# Patient Record
Sex: Female | Born: 1965
Health system: Southern US, Community
[De-identification: ages and names within clinical notes are randomized; demographics above are authoritative.]

## PROBLEM LIST (undated history)

## (undated) DIAGNOSIS — G43909 Migraine, unspecified, not intractable, without status migrainosus: Secondary | ICD-10-CM

## (undated) DIAGNOSIS — M51369 Other intervertebral disc degeneration, lumbar region without mention of lumbar back pain or lower extremity pain: Secondary | ICD-10-CM

## (undated) DIAGNOSIS — E119 Type 2 diabetes mellitus without complications: Secondary | ICD-10-CM

## (undated) DIAGNOSIS — D649 Anemia, unspecified: Secondary | ICD-10-CM

## (undated) DIAGNOSIS — H6690 Otitis media, unspecified, unspecified ear: Secondary | ICD-10-CM

## (undated) DIAGNOSIS — Z87898 Personal history of other specified conditions: Secondary | ICD-10-CM

## (undated) DIAGNOSIS — Z8679 Personal history of other diseases of the circulatory system: Secondary | ICD-10-CM

## (undated) DIAGNOSIS — E785 Hyperlipidemia, unspecified: Secondary | ICD-10-CM

## (undated) DIAGNOSIS — M5136 Other intervertebral disc degeneration, lumbar region: Secondary | ICD-10-CM

## (undated) DIAGNOSIS — R197 Diarrhea, unspecified: Secondary | ICD-10-CM

## (undated) DIAGNOSIS — L0390 Cellulitis, unspecified: Secondary | ICD-10-CM

## (undated) DIAGNOSIS — M545 Low back pain, unspecified: Secondary | ICD-10-CM

## (undated) DIAGNOSIS — M797 Fibromyalgia: Secondary | ICD-10-CM

## (undated) DIAGNOSIS — I8390 Asymptomatic varicose veins of unspecified lower extremity: Secondary | ICD-10-CM

## (undated) DIAGNOSIS — K219 Gastro-esophageal reflux disease without esophagitis: Secondary | ICD-10-CM

## (undated) DIAGNOSIS — Z8709 Personal history of other diseases of the respiratory system: Secondary | ICD-10-CM

## (undated) DIAGNOSIS — M179 Osteoarthritis of knee, unspecified: Secondary | ICD-10-CM

## (undated) DIAGNOSIS — M1710 Unilateral primary osteoarthritis, unspecified knee: Secondary | ICD-10-CM

## (undated) DIAGNOSIS — F32A Depression, unspecified: Secondary | ICD-10-CM

## (undated) DIAGNOSIS — F329 Major depressive disorder, single episode, unspecified: Secondary | ICD-10-CM

## (undated) DIAGNOSIS — S62609A Fracture of unspecified phalanx of unspecified finger, initial encounter for closed fracture: Secondary | ICD-10-CM

## (undated) HISTORY — PX: CARDIOVASCULAR STRESS TEST: SHX262

## (undated) HISTORY — PX: VARICOSE VEIN SURGERY: SHX832

---

## 2010-07-30 ENCOUNTER — Encounter
Admission: RE | Admit: 2010-07-30 | Discharge: 2010-07-30 | Payer: Self-pay | Source: Home / Self Care | Attending: Internal Medicine | Admitting: Internal Medicine

## 2010-08-24 ENCOUNTER — Encounter: Payer: Self-pay | Admitting: Internal Medicine

## 2010-10-24 ENCOUNTER — Ambulatory Visit
Admission: RE | Admit: 2010-10-24 | Discharge: 2010-10-24 | Disposition: A | Discharge status: Home / Self Care | Payer: Medicaid Other | Source: Ambulatory Visit | Attending: Rheumatology | Admitting: Rheumatology

## 2010-10-24 ENCOUNTER — Other Ambulatory Visit: Payer: Self-pay | Admitting: Rheumatology

## 2010-10-24 DIAGNOSIS — M542 Cervicalgia: Secondary | ICD-10-CM

## 2011-07-11 ENCOUNTER — Other Ambulatory Visit: Payer: Self-pay | Admitting: Internal Medicine

## 2011-07-11 DIAGNOSIS — Z1231 Encounter for screening mammogram for malignant neoplasm of breast: Secondary | ICD-10-CM

## 2011-09-09 DIAGNOSIS — J309 Allergic rhinitis, unspecified: Secondary | ICD-10-CM | POA: Insufficient documentation

## 2011-09-12 ENCOUNTER — Ambulatory Visit
Admission: RE | Admit: 2011-09-12 | Discharge: 2011-09-12 | Disposition: A | Discharge status: Home / Self Care | Payer: Medicaid Other | Source: Ambulatory Visit | Attending: Internal Medicine | Admitting: Internal Medicine

## 2011-09-12 DIAGNOSIS — Z1231 Encounter for screening mammogram for malignant neoplasm of breast: Secondary | ICD-10-CM

## 2012-04-08 ENCOUNTER — Ambulatory Visit (HOSPITAL_COMMUNITY)
Admission: RE | Admit: 2012-04-08 | Payer: Medicaid Other | Source: Ambulatory Visit | Admitting: Obstetrics and Gynecology

## 2012-04-08 ENCOUNTER — Encounter (HOSPITAL_COMMUNITY): Admission: RE | Payer: Self-pay | Source: Ambulatory Visit

## 2012-04-08 SURGERY — DILATATION & CURETTAGE/HYSTEROSCOPY WITH NOVASURE ABLATION
Anesthesia: Choice

## 2012-07-24 ENCOUNTER — Emergency Department (HOSPITAL_COMMUNITY)
Admission: EM | Admit: 2012-07-24 | Discharge: 2012-07-24 | Disposition: A | Discharge status: Home / Self Care | Payer: Medicaid Other | Attending: Emergency Medicine | Admitting: Emergency Medicine

## 2012-07-24 ENCOUNTER — Encounter (HOSPITAL_COMMUNITY): Payer: Self-pay | Admitting: Emergency Medicine

## 2012-07-24 DIAGNOSIS — R05 Cough: Secondary | ICD-10-CM | POA: Insufficient documentation

## 2012-07-24 DIAGNOSIS — R509 Fever, unspecified: Secondary | ICD-10-CM | POA: Insufficient documentation

## 2012-07-24 DIAGNOSIS — Z79899 Other long term (current) drug therapy: Secondary | ICD-10-CM | POA: Insufficient documentation

## 2012-07-24 DIAGNOSIS — H9209 Otalgia, unspecified ear: Secondary | ICD-10-CM | POA: Insufficient documentation

## 2012-07-24 DIAGNOSIS — E119 Type 2 diabetes mellitus without complications: Secondary | ICD-10-CM | POA: Insufficient documentation

## 2012-07-24 DIAGNOSIS — R059 Cough, unspecified: Secondary | ICD-10-CM | POA: Insufficient documentation

## 2012-07-24 DIAGNOSIS — J3489 Other specified disorders of nose and nasal sinuses: Secondary | ICD-10-CM | POA: Insufficient documentation

## 2012-07-24 DIAGNOSIS — J329 Chronic sinusitis, unspecified: Secondary | ICD-10-CM | POA: Insufficient documentation

## 2012-07-24 HISTORY — DX: Type 2 diabetes mellitus without complications: E11.9

## 2012-07-24 MED ORDER — ALBUTEROL SULFATE HFA 108 (90 BASE) MCG/ACT IN AERS: 1.0000 | INHALATION_SPRAY | Freq: Four times a day (QID) | RESPIRATORY_TRACT | Status: DC | PRN

## 2012-07-24 MED ORDER — AMOXICILLIN-POT CLAVULANATE 500-125 MG PO TABS: 1.0000 | ORAL_TABLET | Freq: Three times a day (TID) | ORAL | Status: DC

## 2012-07-24 NOTE — ED Notes (Signed)
Pt. Continues to cough

## 2012-07-24 NOTE — ED Notes (Signed)
Pt. Stated, I've had cold runny nose with earache and headache for 2 weeks.

## 2012-07-24 NOTE — ED Provider Notes (Signed)
History   This chart was scribed for Nelia Shi, MD, by Frederik Pear, ER scribe. The patient was seen in room TR11C/TR11C and the patient's care was started at 1425.    CSN: [id number]  Arrival date & time 07/24/12  1354   First MD Initiated Contact with Patient 07/24/12 1425      Chief Complaint  Patient presents with  . URI     HPI Comments: Kathy Perez is a 46 y.o. female who presents to the Emergency Department complaining of constant, moderate congestion with associated rhinorrhea, productive cough with green sputum, ear pain bilaterally that is worse on the left, chills, and an intermittent fever that began 2 weeks ago. She has a h/o of DM and only takes pills. She states that she traveled out of the country 2 months ago, but denies any sick contacts.   Past Medical History  Diagnosis Date  . Diabetes mellitus without complication     History reviewed. No pertinent past surgical history.  No family history on file.  History  Substance Use Topics  . Smoking status: Not on file  . Smokeless tobacco: Not on file  . Alcohol Use: No    OB History    Grav Para Term Preterm Abortions TAB SAB Ect Mult Living                  Review of Systems A complete 10 system review of systems was obtained and all systems are negative except as noted in the HPI and PMH.   Allergies  Review of patient's allergies indicates no known allergies.  Home Medications   Current Outpatient Rx  Name  Route  Sig  Dispense  Refill  . ALBUTEROL SULFATE HFA 108 (90 BASE) MCG/ACT IN AERS   Inhalation   Inhale 1-2 puffs into the lungs every 6 (six) hours as needed for wheezing.   1 Inhaler   0   . AMOXICILLIN-POT CLAVULANATE 500-125 MG PO TABS   Oral   Take 1 tablet (500 mg total) by mouth every 8 (eight) hours.   21 tablet   0     BP 128/78  Pulse 90  Temp 97.8 F (36.6 C) (Oral)  Resp 18  SpO2 97%  LMP 06/28/2012  Physical Exam  Nursing note and vitals  reviewed. Constitutional: She is oriented to person, place, and time. She appears well-developed and well-nourished. No distress.  HENT:  Head: Normocephalic and atraumatic.  Eyes: Pupils are equal, round, and reactive to light.  Neck: Normal range of motion.  Cardiovascular: Normal rate and intact distal pulses.   Pulmonary/Chest: No respiratory distress.  Abdominal: Normal appearance. She exhibits no distension.  Musculoskeletal: Normal range of motion.  Neurological: She is alert and oriented to person, place, and time. No cranial nerve deficit.  Skin: Skin is warm and dry. No rash noted.  Psychiatric: She has a normal mood and affect. Her behavior is normal.    ED Course  Procedures (including critical care time)  DIAGNOSTIC STUDIES: Oxygen Saturation is 97% on room air, adequate by my interpretation.    COORDINATION OF CARE:  14:30- Discussed planned course of treatment with the patient, including albuterol and antibiotics, who is agreeable at this time.  Labs Reviewed - No data to display No results found.   1. Sinusitis       MDM  I personally performed the services described in this documentation, which was scribed in my presence. The recorded information has been reviewed  and considered.       Nelia Shi, MD 07/25/12 [phone]

## 2012-10-25 ENCOUNTER — Other Ambulatory Visit: Payer: Self-pay

## 2012-10-25 ENCOUNTER — Other Ambulatory Visit: Payer: Self-pay | Admitting: Internal Medicine

## 2012-10-25 DIAGNOSIS — Z1231 Encounter for screening mammogram for malignant neoplasm of breast: Secondary | ICD-10-CM

## 2012-12-01 ENCOUNTER — Ambulatory Visit
Admission: RE | Admit: 2012-12-01 | Discharge: 2012-12-01 | Disposition: A | Discharge status: Home / Self Care | Payer: Medicaid Other | Source: Ambulatory Visit

## 2012-12-01 DIAGNOSIS — Z1231 Encounter for screening mammogram for malignant neoplasm of breast: Secondary | ICD-10-CM

## 2013-08-16 DIAGNOSIS — D649 Anemia, unspecified: Secondary | ICD-10-CM | POA: Insufficient documentation

## 2013-08-26 DIAGNOSIS — R202 Paresthesia of skin: Secondary | ICD-10-CM | POA: Insufficient documentation

## 2013-09-12 DIAGNOSIS — M064 Inflammatory polyarthropathy: Secondary | ICD-10-CM | POA: Insufficient documentation

## 2013-09-12 DIAGNOSIS — M25569 Pain in unspecified knee: Secondary | ICD-10-CM | POA: Insufficient documentation

## 2013-09-12 DIAGNOSIS — B9681 Helicobacter pylori [H. pylori] as the cause of diseases classified elsewhere: Secondary | ICD-10-CM | POA: Insufficient documentation

## 2013-09-12 DIAGNOSIS — M1990 Unspecified osteoarthritis, unspecified site: Secondary | ICD-10-CM | POA: Insufficient documentation

## 2013-09-12 DIAGNOSIS — K289 Gastrojejunal ulcer, unspecified as acute or chronic, without hemorrhage or perforation: Secondary | ICD-10-CM

## 2013-09-12 DIAGNOSIS — M797 Fibromyalgia: Secondary | ICD-10-CM | POA: Insufficient documentation

## 2013-11-11 DIAGNOSIS — M549 Dorsalgia, unspecified: Secondary | ICD-10-CM | POA: Insufficient documentation

## 2013-12-15 DIAGNOSIS — M797 Fibromyalgia: Secondary | ICD-10-CM

## 2013-12-15 HISTORY — DX: Fibromyalgia: M79.7

## 2013-12-29 HISTORY — PX: AV NODE ABLATION: SHX1209

## 2014-04-05 DIAGNOSIS — R413 Other amnesia: Secondary | ICD-10-CM | POA: Insufficient documentation

## 2014-05-10 ENCOUNTER — Other Ambulatory Visit: Payer: Self-pay

## 2014-05-10 DIAGNOSIS — Z1231 Encounter for screening mammogram for malignant neoplasm of breast: Secondary | ICD-10-CM

## 2014-05-18 ENCOUNTER — Encounter (INDEPENDENT_AMBULATORY_CARE_PROVIDER_SITE_OTHER): Payer: Self-pay

## 2014-05-18 ENCOUNTER — Ambulatory Visit
Admission: RE | Admit: 2014-05-18 | Discharge: 2014-05-18 | Disposition: A | Discharge status: Home / Self Care | Payer: Medicaid Other | Source: Ambulatory Visit

## 2014-05-18 DIAGNOSIS — Z1231 Encounter for screening mammogram for malignant neoplasm of breast: Secondary | ICD-10-CM

## 2014-06-26 DIAGNOSIS — L0390 Cellulitis, unspecified: Secondary | ICD-10-CM

## 2014-06-26 HISTORY — DX: Cellulitis, unspecified: L03.90

## 2014-12-25 DIAGNOSIS — I471 Supraventricular tachycardia: Secondary | ICD-10-CM | POA: Insufficient documentation

## 2015-01-03 HISTORY — PX: CARDIAC CATHETERIZATION: SHX172

## 2015-01-25 DIAGNOSIS — I8390 Asymptomatic varicose veins of unspecified lower extremity: Secondary | ICD-10-CM | POA: Insufficient documentation

## 2015-03-05 DIAGNOSIS — R197 Diarrhea, unspecified: Secondary | ICD-10-CM

## 2015-03-05 HISTORY — DX: Diarrhea, unspecified: R19.7

## 2015-06-14 ENCOUNTER — Ambulatory Visit: Payer: Medicaid Other | Attending: Family Medicine

## 2015-06-15 ENCOUNTER — Encounter: Payer: Self-pay | Admitting: Family Medicine

## 2015-06-15 ENCOUNTER — Ambulatory Visit: Payer: Medicaid Other | Attending: Family Medicine | Admitting: Family Medicine

## 2015-06-15 VITALS — BP 113/77 | HR 82 | Temp 98.2°F | Resp 16 | Ht 63.0 in | Wt 177.0 lb

## 2015-06-15 DIAGNOSIS — Z0000 Encounter for general adult medical examination without abnormal findings: Secondary | ICD-10-CM | POA: Insufficient documentation

## 2015-06-15 DIAGNOSIS — I8393 Asymptomatic varicose veins of bilateral lower extremities: Secondary | ICD-10-CM | POA: Insufficient documentation

## 2015-06-15 DIAGNOSIS — K219 Gastro-esophageal reflux disease without esophagitis: Secondary | ICD-10-CM | POA: Insufficient documentation

## 2015-06-15 DIAGNOSIS — I471 Supraventricular tachycardia: Secondary | ICD-10-CM

## 2015-06-15 DIAGNOSIS — J0120 Acute ethmoidal sinusitis, unspecified: Secondary | ICD-10-CM | POA: Insufficient documentation

## 2015-06-15 DIAGNOSIS — M170 Bilateral primary osteoarthritis of knee: Secondary | ICD-10-CM | POA: Insufficient documentation

## 2015-06-15 DIAGNOSIS — E119 Type 2 diabetes mellitus without complications: Secondary | ICD-10-CM | POA: Insufficient documentation

## 2015-06-15 DIAGNOSIS — I8390 Asymptomatic varicose veins of unspecified lower extremity: Secondary | ICD-10-CM

## 2015-06-15 LAB — GLUCOSE, POCT (MANUAL RESULT ENTRY): POC Glucose: 108 mg/dl — AB (ref 70–99)

## 2015-06-15 LAB — POCT GLYCOSYLATED HEMOGLOBIN (HGB A1C): Hemoglobin A1C: 6

## 2015-06-15 MED ORDER — DICLOFENAC SODIUM 75 MG PO TBEC: 75.0000 mg | DELAYED_RELEASE_TABLET | Freq: Two times a day (BID) | ORAL | Status: DC

## 2015-06-15 MED ORDER — METFORMIN HCL 500 MG PO TABS: ORAL_TABLET | ORAL | Status: DC

## 2015-06-15 MED ORDER — PRAVASTATIN SODIUM 40 MG PO TABS: ORAL_TABLET | ORAL | Status: DC

## 2015-06-15 MED ORDER — AMOXICILLIN 500 MG PO CAPS: 500.0000 mg | ORAL_CAPSULE | Freq: Three times a day (TID) | ORAL | Status: DC

## 2015-06-15 MED ORDER — PREGABALIN 150 MG PO CAPS: 150.0000 mg | ORAL_CAPSULE | Freq: Two times a day (BID) | ORAL | Status: DC

## 2015-06-15 NOTE — Progress Notes (Signed)
Pt's here to est. care with PCP. Pt denies pain today.  Pt states that she having sinus pressure with runny nose, throat pain, clogged ears x2wks ago.  Pt requesting refill of medication.

## 2015-06-15 NOTE — Patient Instructions (Signed)

## 2015-06-15 NOTE — Progress Notes (Signed)
CC: Here to establish care  HPI: Kathy Perez is a 49 y.o. 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  Records from care every where reviewed. Last seen by cardiology in 04/2015) status post cardiac cath in 01/2015-normal coronaries as per cardiology note) and follow-up on an as needed basis was recommended. She takes Lyrica for neuropathic pain in her hands and feet. She remains compliant with her diabetic medications and is needing refills today; does not exercise that much but adheres to a diabetic diet.  She complains of sinus pressure which she has had for the last 2 weeks including rhinorrhea, otalgia and occasional headaches but denies fever, shortness of breath or chest pain. She has not used any over-the-counter medications.  No Known Allergies Past Medical History  Diagnosis Date  . Diabetes mellitus without complication [facility](HCC)    Current Outpatient Prescriptions on File Prior to Visit  Medication Sig Dispense Refill  . albuterol (PROVENTIL HFA;VENTOLIN HFA) 108 (90 BASE) MCG/ACT inhaler Inhale 1-2 puffs into the lungs every 6 (six) hours as needed for wheezing. (Patient not taking: Reported on 06/15/2015) 1 Inhaler 0  . amoxicillin-clavulanate (AUGMENTIN) 500-125 MG per tablet Take 1 tablet (500 mg total) by mouth every 8 (eight) hours. (Patient not taking: Reported on 06/15/2015) 21 tablet 0   No current facility-administered medications on file prior to visit.   History reviewed. No pertinent family history. Social History   Social History  . Marital Status: Married    Spouse Name: N/A  . Number of Children: N/A  . Years of Education: N/A   Occupational History  . Not on file.   Social History Main Topics  . Smoking status: Never Smoker   . Smokeless tobacco: Not  on file  . Alcohol Use: No  . Drug Use: No  . Sexual Activity: Not on file   Other Topics Concern  . Not on file   Social History Narrative    Review of Systems: Constitutional: Negative for fever, chills, diaphoresis, activity change, appetite change and fatigue. HENT: see hpi Eyes: Negative for pain, discharge, redness, itching and visual disturbance. Respiratory: Negative for cough, choking, chest tightness, shortness of breath, wheezing and stridor.  Cardiovascular: Negative for chest pain, palpitations and leg swelling. Gastrointestinal: Negative for abdominal distention. Genitourinary: Negative for dysuria, urgency, frequency, hematuria, flank pain, decreased urine volume, difficulty urinating and dyspareunia.  Musculoskeletal: Negative for back pain, joint swelling, arthralgias and gait problem. Neurological: Negative for dizziness, tremors, seizures, syncope, facial asymmetry, speech difficulty, weakness, light-headedness, numbness and headaches.  Hematological: Negative for adenopathy. Does not bruise/bleed easily. Psychiatric/Behavioral: Negative for hallucinations, behavioral problems, confusion, dysphoric mood, decreased concentration and agitation.    Objective:   Filed Vitals:   06/15/15 1132  BP: 113/77  Pulse: 82  Temp: 98.2 F (36.8 C)  Resp: 16    Physical Exam: Constitutional: Patient appears well-developed and well-nourished. No distress. HENT: Normocephalic, atraumatic, External right and left ear normal. Oropharynx is clear and moist, mild ethmoidal and maxillary sinus tenderness. Eyes: Conjunctivae and EOM are normal. PERRLA, no scleral icterus. Neck: Normal ROM. Neck supple. No JVD. No tracheal deviation. No thyromegaly. CVS: RRR, S1/S2 +, no murmurs, no gallops, no carotid bruit.  Pulmonary: Effort and breath sounds normal, no stridor, rhonchi, wheezes, rales.  Abdominal: Soft. BS +,  no distension, tenderness,  rebound or guarding.    Musculoskeletal: Normal range of motion. No edema and no tenderness.  Lymphadenopathy: No lymphadenopathy noted, cervical, inguinal or axillary Neuro: Alert. Normal reflexes, muscle tone coordination. No cranial nerve deficit. Skin: Skin is warm and dry. No rash noted. Not diaphoretic. No erythema. No pallor. Psychiatric: Normal mood and affect. Behavior, judgment, thought content normal.     Assessment and plan:  Type 2 diabetes mellitus: Controlled with A1c of 6.0 Continue metformin. Fasting labs ordered against Monday. Up-to-date on Pneumovax and annual eye exam. Microalbumin sent off today.  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  Osteoarthritis: Controlled on NSAIDs.  Sinusitis: Placed on amoxicillin.      Jaclyn Shaggy, MD. [facility]ness [phone] 06/15/2015, 12:15 PM

## 2015-06-16 LAB — MICROALBUMIN / CREATININE URINE RATIO: Creatinine, Urine: 11 mg/dL — ABNORMAL LOW (ref 20–320)

## 2015-06-18 ENCOUNTER — Ambulatory Visit: Payer: Medicaid Other | Attending: Family Medicine

## 2015-06-18 ENCOUNTER — Other Ambulatory Visit: Payer: Self-pay | Admitting: Family Medicine

## 2015-06-18 DIAGNOSIS — E119 Type 2 diabetes mellitus without complications: Secondary | ICD-10-CM

## 2015-06-18 LAB — COMPREHENSIVE METABOLIC PANEL
ALK PHOS: 54 U/L (ref 33–130)
ALT: 25 U/L (ref 6–29)
AST: 20 U/L (ref 10–35)
Albumin: 4.1 g/dL (ref 3.6–5.1)
BUN: 8 mg/dL (ref 7–25)
CALCIUM: 9.5 mg/dL (ref 8.6–10.4)
CHLORIDE: 104 mmol/L (ref 98–110)
CO2: 24 mmol/L (ref 20–31)
Creat: 0.65 mg/dL (ref 0.50–1.05)
GLUCOSE: 112 mg/dL — AB (ref 65–99)
POTASSIUM: 4.5 mmol/L (ref 3.5–5.3)
Sodium: 140 mmol/L (ref 135–146)
Total Bilirubin: 0.4 mg/dL (ref 0.2–1.2)
Total Protein: 7.2 g/dL (ref 6.1–8.1)

## 2015-06-18 LAB — LIPID PANEL
CHOL/HDL RATIO: 4 ratio (ref <=5.0)
CHOLESTEROL: 171 mg/dL (ref 125–200)
HDL: 43 mg/dL — AB (ref >=46)
LDL CALC: 90 mg/dL (ref <130)
TRIGLYCERIDES: 192 mg/dL — AB (ref <150)
VLDL: 38 mg/dL — AB (ref <30)

## 2015-06-25 ENCOUNTER — Telehealth: Payer: Self-pay

## 2015-06-25 NOTE — Telephone Encounter (Signed)
CMA called patient, patient didn't answer. I left a message for the patient to return my call asap. Patient will be sent a letter in the mail for her to contact the office upon receipt of the letter.

## 2015-06-25 NOTE — Telephone Encounter (Signed)
-----   Message from Jaclyn ShaggyEnobong Amao, MD sent at 06/19/2015  4:07 PM EST ----- Her total cholesterol is normal but her triglycerides are mildly elevated and HDL (good cholesterol) is lower than normal. Advised to increase physical activity, OTC omega-3 fatty acid capsules would also be helpful

## 2015-07-16 ENCOUNTER — Ambulatory Visit: Payer: Medicaid Other | Attending: Family Medicine

## 2015-07-23 ENCOUNTER — Other Ambulatory Visit: Payer: Self-pay | Admitting: Family Medicine

## 2015-07-23 MED ORDER — PREGABALIN 150 MG PO CAPS: 150.0000 mg | ORAL_CAPSULE | Freq: Two times a day (BID) | ORAL | Status: DC

## 2015-09-06 ENCOUNTER — Other Ambulatory Visit: Payer: Self-pay | Admitting: Family Medicine

## 2015-09-07 MED FILL — ?DICLOFENAC SOD DR 75 MG TA: 75 | 30 days supply | Qty: 60 | Fill #0

## 2015-09-07 MED FILL — metFORMIN HCL 500 MG TABS: 500 | 30 days supply | Qty: 30 | Fill #0

## 2015-09-07 MED FILL — PRAVASTATIN NA 40 MG TAB: 40 | 30 days supply | Qty: 30 | Fill #0

## 2015-09-24 ENCOUNTER — Encounter: Payer: Self-pay | Admitting: Family Medicine

## 2015-09-24 ENCOUNTER — Ambulatory Visit (HOSPITAL_COMMUNITY)
Admission: RE | Admit: 2015-09-24 | Discharge: 2015-09-24 | Disposition: A | Discharge status: Home / Self Care | Payer: Medicaid Other | Source: Ambulatory Visit | Attending: Family Medicine | Admitting: Family Medicine

## 2015-09-24 ENCOUNTER — Ambulatory Visit: Payer: Self-pay | Attending: Family Medicine | Admitting: Family Medicine

## 2015-09-24 VITALS — BP 134/84 | HR 75 | Temp 98.2°F | Resp 15 | Ht 63.0 in | Wt 179.0 lb

## 2015-09-24 DIAGNOSIS — M79645 Pain in left finger(s): Secondary | ICD-10-CM | POA: Insufficient documentation

## 2015-09-24 DIAGNOSIS — S62651A Nondisplaced fracture of medial phalanx of left index finger, initial encounter for closed fracture: Secondary | ICD-10-CM | POA: Insufficient documentation

## 2015-09-24 DIAGNOSIS — M1990 Unspecified osteoarthritis, unspecified site: Secondary | ICD-10-CM | POA: Insufficient documentation

## 2015-09-24 DIAGNOSIS — E1140 Type 2 diabetes mellitus with diabetic neuropathy, unspecified: Secondary | ICD-10-CM | POA: Insufficient documentation

## 2015-09-24 DIAGNOSIS — M797 Fibromyalgia: Secondary | ICD-10-CM

## 2015-09-24 DIAGNOSIS — E781 Pure hyperglyceridemia: Secondary | ICD-10-CM | POA: Insufficient documentation

## 2015-09-24 DIAGNOSIS — Z79899 Other long term (current) drug therapy: Secondary | ICD-10-CM | POA: Insufficient documentation

## 2015-09-24 DIAGNOSIS — H6121 Impacted cerumen, right ear: Secondary | ICD-10-CM

## 2015-09-24 DIAGNOSIS — W230XXA Caught, crushed, jammed, or pinched between moving objects, initial encounter: Secondary | ICD-10-CM | POA: Insufficient documentation

## 2015-09-24 DIAGNOSIS — E785 Hyperlipidemia, unspecified: Secondary | ICD-10-CM

## 2015-09-24 DIAGNOSIS — M170 Bilateral primary osteoarthritis of knee: Secondary | ICD-10-CM

## 2015-09-24 DIAGNOSIS — E1149 Type 2 diabetes mellitus with other diabetic neurological complication: Secondary | ICD-10-CM

## 2015-09-24 LAB — GLUCOSE, POCT (MANUAL RESULT ENTRY): POC GLUCOSE: 88 mg/dL (ref 70–99)

## 2015-09-24 LAB — POCT GLYCOSYLATED HEMOGLOBIN (HGB A1C): Hemoglobin A1C: 5.8

## 2015-09-24 MED ORDER — PRAVASTATIN SODIUM 40 MG PO TABS: 40.0000 mg | ORAL_TABLET | Freq: Every evening | ORAL | Status: DC

## 2015-09-24 MED ORDER — DICLOFENAC SODIUM 75 MG PO TBEC: 75.0000 mg | DELAYED_RELEASE_TABLET | Freq: Two times a day (BID) | ORAL | Status: DC

## 2015-09-24 MED ORDER — METFORMIN HCL 500 MG PO TABS: 500.0000 mg | ORAL_TABLET | Freq: Two times a day (BID) | ORAL | Status: DC

## 2015-09-24 MED ORDER — LISINOPRIL 2.5 MG PO TABS: 5.0000 mg | ORAL_TABLET | Freq: Every day | ORAL | Status: DC

## 2015-09-24 MED FILL — LISINOPRIL 2.5 MG TABLET: 2.5 | 15 days supply | Qty: 30 | Fill #0

## 2015-09-24 NOTE — Patient Instructions (Signed)
Diabetes Mellitus and Food It is important for you to manage your blood sugar (glucose) level. Your blood glucose level can be greatly affected by what you eat. Eating healthier foods in the appropriate amounts throughout the day at about the same time each day will help you control your blood glucose level. It can also help slow or prevent worsening of your diabetes mellitus. Healthy eating may even help you improve the level of your blood pressure and reach or maintain a healthy weight.  General recommendations for healthful eating and cooking habits include:  Eating meals and snacks regularly. Avoid going long periods of time without eating to lose weight.  Eating a diet that consists mainly of plant-based foods, such as fruits, vegetables, nuts, legumes, and whole grains.  Using low-heat cooking methods, such as baking, instead of high-heat cooking methods, such as deep frying. Work with your dietitian to make sure you understand how to use the Nutrition Facts information on food labels. HOW CAN FOOD AFFECT ME? Carbohydrates Carbohydrates affect your blood glucose level more than any other type of food. Your dietitian will help you determine how many carbohydrates to eat at each meal and teach you how to count carbohydrates. Counting carbohydrates is important to keep your blood glucose at a healthy level, especially if you are using insulin or taking certain medicines for diabetes mellitus. Alcohol Alcohol can cause sudden decreases in blood glucose (hypoglycemia), especially if you use insulin or take certain medicines for diabetes mellitus. Hypoglycemia can be a life-threatening condition. Symptoms of hypoglycemia (sleepiness, dizziness, and disorientation) are similar to symptoms of having too much alcohol.  If your health care provider has given you approval to drink alcohol, do so in moderation and use the following guidelines:  Women should not have more than one drink per day, and men  should not have more than two drinks per day. One drink is equal to:  12 oz of beer.  5 oz of wine.  1 oz of hard liquor.  Do not drink on an empty stomach.  Keep yourself hydrated. Have water, diet soda, or unsweetened iced tea.  Regular soda, juice, and other mixers might contain a lot of carbohydrates and should be counted. WHAT FOODS ARE NOT RECOMMENDED? As you make food choices, it is important to remember that all foods are not the same. Some foods have fewer nutrients per serving than other foods, even though they might have the same number of calories or carbohydrates. It is difficult to get your body what it needs when you eat foods with fewer nutrients. Examples of foods that you should avoid that are high in calories and carbohydrates but low in nutrients include:  Trans fats (most processed foods list trans fats on the Nutrition Facts label).  Regular soda.  Juice.  Candy.  Sweets, such as cake, pie, doughnuts, and cookies.  Fried foods. WHAT FOODS CAN I EAT? Eat nutrient-rich foods, which will nourish your body and keep you healthy. The food you should eat also will depend on several factors, including:  The calories you need.  The medicines you take.  Your weight.  Your blood glucose level.  Your blood pressure level.  Your cholesterol level. You should eat a variety of foods, including:  Protein.  Lean cuts of meat.  Proteins low in saturated fats, such as fish, egg whites, and beans. Avoid processed meats.  Fruits and vegetables.  Fruits and vegetables that may help control blood glucose levels, such as apples, mangoes, and   yams.  Dairy products.  Choose fat-free or low-fat dairy products, such as milk, yogurt, and cheese.  Grains, bread, pasta, and rice.  Choose whole grain products, such as multigrain bread, whole oats, and brown rice. These foods may help control blood pressure.  Fats.  Foods containing healthful fats, such as nuts,  avocado, olive oil, canola oil, and fish. DOES EVERYONE WITH DIABETES MELLITUS HAVE THE SAME MEAL PLAN? Because every person with diabetes mellitus is different, there is not one meal plan that works for everyone. It is very important that you meet with a dietitian who will help you create a meal plan that is just right for you.   This information is not intended to replace advice given to you by your health care provider. Make sure you discuss any questions you have with your health care provider.   Document Released: 04/17/2005 Document Revised: 08/11/2014 Document Reviewed: 06/17/2013 Elsevier Interactive Patient Education 2016 [employer]  

## 2015-09-24 NOTE — Progress Notes (Signed)
CC: Follow-up on diabetes mellitus  HPI: Kathy Perez is a 50 y.o. 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 Last seen by cardiology in 04/2015 (status post cardiac cath in 01/2015-normal coronaries as per cardiology note) and follow-up on an as needed basis was recommended.  She takes Lyrica for neuropathic pain in her hands and feet. She remains compliant with her diabetic medications and is needing refills today; does not exercise that much but adheres to a diabetic diet.here today for a follow up visit.  Requests a refill of diclofenac which she takes for osteoarthritis.  Today she complains of pain in her left index finger after slamming the door of her, against hand a month ago and has noticed persistent pain in that finger with difficulty bending.  She informs me she  had a well woman exam at the [facility] on [address] and last exam was 2 years ago.  No Known Allergies Past Medical History  Diagnosis Date  . Diabetes mellitus without complication [facility])    Current Outpatient Prescriptions on File Prior to Visit  Medication Sig Dispense Refill  . Omega-3 Krill Oil 500 MG CAPS Take by mouth.    . pregabalin (LYRICA) 150 MG capsule Take 1 capsule (150 mg total) by mouth 2 (two) times daily. 180 capsule 3  . nitroGLYCERIN (NITROSTAT) 0.4 MG SL tablet Place 0.4 mg under the tongue. Reported on 09/24/2015    . UNABLE TO FIND Take by mouth.     No current facility-administered medications on file prior to visit.   History reviewed. No pertinent family history. Social History   Social History  . Marital Status: Married    Spouse Name: N/A  . Number of Children: N/A  . Years of Education: N/A   Occupational History  . Not on file.   Social History Main Topics  . Smoking status: Never Smoker     . Smokeless tobacco: Not on file  . Alcohol Use: No  . Drug Use: No  . Sexual Activity: Not on file   Other Topics Concern  . Not on file   Social History Narrative    Review of Systems: Constitutional: Negative for fever, chills, diaphoresis, activity change, appetite change and fatigue. HENT: Negative for ear pain, nosebleeds, congestion, facial swelling, rhinorrhea, neck pain, neck stiffness and ear discharge.  Eyes: Negative for pain, discharge, redness, itching and visual disturbance. Respiratory: Negative for cough, choking, chest tightness, shortness of breath, wheezing and stridor.  Cardiovascular: Negative for chest pain, palpitations and leg swelling. Gastrointestinal: Negative for abdominal distention. Genitourinary: Negative for dysuria, urgency, frequency, hematuria, flank pain, decreased urine volume, difficulty urinating and dyspareunia.  Musculoskeletal: See history of present illness Neurological: Negative for dizziness, tremors, seizures, syncope, facial asymmetry, speech difficulty, weakness, light-headedness, numbness and headaches.  Hematological: Negative for adenopathy. Does not bruise/bleed easily. Psychiatric/Behavioral: Negative for hallucinations, behavioral problems, confusion, dysphoric mood, decreased concentration and agitation.    Objective:   Filed Vitals:   09/24/15 1611  BP: 134/84  Pulse: 75  Temp: 98.2 F (36.8 C)  Resp: 15    Physical Exam: Constitutional: Patient appears well-developed and well-nourished. No distress. HENT: Normocephalic, atraumatic, cerumen obscuring right TM, left is normal. Oropharynx is clear and moist.  Eyes: Conjunctivae and EOM are normal. PERRLA, no scleral icterus. Neck: Normal ROM. Neck supple. No JVD.  No tracheal deviation. No thyromegaly. CVS: RRR, S1/S2 +, no murmurs, no gallops, no carotid bruit.  Pulmonary: Effort and breath sounds normal, no stridor, rhonchi, wheezes, rales.  Abdominal: Soft. BS +,   no distension, tenderness, rebound or guarding.  Musculoskeletal: Left index finger with scar on dorsal and volar and associated tenderness on palpation, reduced flexion; unable to make a complete fist in the left hand, right hand is normal.  Lymphadenopathy: No lymphadenopathy noted, cervical, inguinal or axillary Neuro: Alert. Normal reflexes, muscle tone coordination. No cranial nerve deficit. Skin: Skin is warm and dry. No rash noted. Not diaphoretic. No erythema. No pallor. Psychiatric: Normal mood and affect. Behavior, judgment, thought content normal.  No results found for: WBC, HGB, HCT, MCV, PLT Lab Results  Component Value Date   CREATININE 0.65 06/18/2015   BUN 8 06/18/2015   NA 140 06/18/2015   K 4.5 06/18/2015   CL 104 06/18/2015   CO2 24 06/18/2015    Lab Results  Component Value Date   HGBA1C 5.80 09/24/2015   Lipid Panel     Component Value Date/Time   CHOL 171 06/18/2015 1020   TRIG 192* 06/18/2015 1020   HDL 43* 06/18/2015 1020   CHOLHDL 4.0 06/18/2015 1020   VLDL 38* 06/18/2015 1020   LDLCALC 90 06/18/2015 1020       Assessment and plan:   Type 2 diabetes mellitus with diabetic neuropathy: -Controlled with A1c of 5.8 -Continue metformin 500 mg twice a day -Low-dose ACE inhibitor added to regimen -Foot exam performed today, advised to schedule annual eye exam. -Continue with ADA diet, lifestyle modifications  Hyperlipidemia: -Controlled with mild hypertriglyceridemia -Continue pravastatin -Low-cholesterol diet  Osteoarthritis of the knees: -Takes diclofenac which helps symptoms.  Left index finger pain: -Secondary to trauma -Was sent for an x-ray -If symptoms persist will need to refer to hand surgeon given flexion abnormality  Cerumen impaction: Advised to use OTC ceruminolytics and avoid use of Q-tips  Advised to schedule annual physical for health care maintenance  Jaclyn Shaggy, MD. [facility] and  Wellness [phone] 09/24/2015, 4:42 PM

## 2015-09-24 NOTE — Progress Notes (Signed)
Patient complains of right pointer finger pain and swelling-she slammed it in a door a month ago-no xray obtained She needs refills on her medications She states she takes her metformin bid instead of the prescribed q day

## 2015-09-25 DIAGNOSIS — S62609A Fracture of unspecified phalanx of unspecified finger, initial encounter for closed fracture: Secondary | ICD-10-CM

## 2015-09-25 HISTORY — DX: Fracture of unspecified phalanx of unspecified finger, initial encounter for closed fracture: S62.609A

## 2015-09-27 ENCOUNTER — Telehealth: Payer: Self-pay | Admitting: *Deleted

## 2015-09-27 NOTE — Telephone Encounter (Signed)
-----   Message from Jaclyn Shaggy, MD sent at 09/26/2015  5:02 PM EST ----- She does have a healing nondisplaced fracture of the left index finger and given the history of trauma of 4 weeks ago there is no new recommendation. Continue analgesics and range of motion exercises as tolerated and I will see her back in 2 weeks.

## 2015-09-27 NOTE — Telephone Encounter (Signed)
Verified name and date of birth and gave results and instructions

## 2015-09-28 ENCOUNTER — Emergency Department (HOSPITAL_COMMUNITY)
Admission: EM | Admit: 2015-09-28 | Discharge: 2015-09-28 | Disposition: A | Discharge status: Home / Self Care | Payer: Medicaid Other | Attending: Emergency Medicine | Admitting: Emergency Medicine

## 2015-09-28 ENCOUNTER — Encounter (HOSPITAL_COMMUNITY): Payer: Self-pay | Admitting: Emergency Medicine

## 2015-09-28 DIAGNOSIS — S62609A Fracture of unspecified phalanx of unspecified finger, initial encounter for closed fracture: Secondary | ICD-10-CM

## 2015-09-28 DIAGNOSIS — Z791 Long term (current) use of non-steroidal anti-inflammatories (NSAID): Secondary | ICD-10-CM | POA: Insufficient documentation

## 2015-09-28 DIAGNOSIS — Y92810 Car as the place of occurrence of the external cause: Secondary | ICD-10-CM | POA: Insufficient documentation

## 2015-09-28 DIAGNOSIS — E119 Type 2 diabetes mellitus without complications: Secondary | ICD-10-CM | POA: Insufficient documentation

## 2015-09-28 DIAGNOSIS — Z79899 Other long term (current) drug therapy: Secondary | ICD-10-CM | POA: Insufficient documentation

## 2015-09-28 DIAGNOSIS — W230XXA Caught, crushed, jammed, or pinched between moving objects, initial encounter: Secondary | ICD-10-CM | POA: Insufficient documentation

## 2015-09-28 DIAGNOSIS — Z7984 Long term (current) use of oral hypoglycemic drugs: Secondary | ICD-10-CM | POA: Insufficient documentation

## 2015-09-28 DIAGNOSIS — S62651A Nondisplaced fracture of medial phalanx of left index finger, initial encounter for closed fracture: Secondary | ICD-10-CM | POA: Insufficient documentation

## 2015-09-28 DIAGNOSIS — Y998 Other external cause status: Secondary | ICD-10-CM | POA: Insufficient documentation

## 2015-09-28 DIAGNOSIS — Y9389 Activity, other specified: Secondary | ICD-10-CM | POA: Insufficient documentation

## 2015-09-28 MED ORDER — IBUPROFEN 800 MG PO TABS: 800.0000 mg | ORAL_TABLET | Freq: Three times a day (TID) | ORAL | Status: DC

## 2015-09-28 MED ORDER — BUPIVACAINE HCL 0.5 % IJ SOLN
5.0000 mL | Freq: Once | INTRAMUSCULAR | Status: AC
  Administered 2015-09-28: 5 mL
  Filled 2015-09-28: qty 5

## 2015-09-28 NOTE — ED Provider Notes (Signed)
CSN: [id number]     Arrival date & time 09/28/15  1719 History  By signing my name below, I, Kathy Perez, attest that this documentation has been prepared under the direction and in the presence of Shawn Joy, PA-C. Electronically Signed: Lyndel Perez, ED Scribe. 09/28/2015. 8:16 PM.   Chief Complaint  Patient presents with  . Finger Injury   The history is provided by the patient. No language interpreter was used.   HPI Comments: Kathy Perez is a 50 y.o. female who presents to the Emergency Department for evaluation of persistent pain to left index finger with associated paraesthesia s/p crush injury that occurred 1 month ago when she closed her left index finger in her car door. She notes onset of pain at that time 1 month ago but she did not seek medical evaluation at that time. The pt was evaluated by her PCP 3 days ago who ordered an Xray that resulted in a healing nondisplaced middle phalangeal fracture of the left index finger. The pt has a follow up appointment with [facility] in 2 weeks but states the pain is too significant to wait till her appointment. She has been taking ibuprofen, tylenol, and aleve without relief of pain.    Past Medical History  Diagnosis Date  . Diabetes mellitus without complication (HCC)    History reviewed. No pertinent past surgical history. History reviewed. No pertinent family history. Social History  Substance Use Topics  . Smoking status: Never Smoker   . Smokeless tobacco: None  . Alcohol Use: No   OB History    No data available     Review of Systems  Musculoskeletal: Positive for arthralgias ( left index finger).  Neurological: Positive for numbness.   Allergies  Review of patient's allergies indicates no known allergies.  Home Medications   Prior to Admission medications   Medication Sig Start Date End Date Taking? Authorizing Provider  diclofenac (VOLTAREN) 75 MG EC tablet Take 1 tablet (75 mg total) by mouth  2 (two) times daily. 09/24/15   Jaclyn Shaggy, MD  ibuprofen (ADVIL,MOTRIN) 800 MG tablet Take 1 tablet (800 mg total) by mouth 3 (three) times daily. 09/28/15   Shawn C Joy, PA-C  lisinopril (PRINIVIL,ZESTRIL) 2.5 MG tablet Take 2 tablets (5 mg total) by mouth daily. 09/24/15   Jaclyn Shaggy, MD  metFORMIN (GLUCOPHAGE) 500 MG tablet Take 1 tablet (500 mg total) by mouth 2 (two) times daily with a meal. 09/24/15   Jaclyn Shaggy, MD  nitroGLYCERIN (NITROSTAT) 0.4 MG SL tablet Place 0.4 mg under the tongue. Reported on 09/24/2015 12/29/14 12/29/15  Historical Provider, MD  Omega-3 Krill Oil 500 MG CAPS Take by mouth.    Historical Provider, MD  pravastatin (PRAVACHOL) 40 MG tablet Take 1 tablet (40 mg total) by mouth every evening. 09/24/15   Jaclyn Shaggy, MD  pregabalin (LYRICA) 150 MG capsule Take 1 capsule (150 mg total) by mouth 2 (two) times daily. 07/23/15   Jaclyn Shaggy, MD  UNABLE TO FIND Take by mouth.    Historical Provider, MD   BP 133/91 mmHg  Pulse 82  Temp(Src) 98.1 F (36.7 C) (Oral)  Resp 18  SpO2 97% Physical Exam  Constitutional: She is oriented to person, place, and time. She appears well-developed and well-nourished. No distress.  HENT:  Head: Normocephalic.  Eyes: Conjunctivae are normal.  Neck: Normal range of motion. Neck supple.  Cardiovascular: Normal rate.   Pulmonary/Chest: Effort normal. No respiratory distress.  Musculoskeletal: Normal  range of motion. She exhibits tenderness.  Minor swelling and pain with palpation and movement of left index finger; paraesthesia to distal aspect of left index finger.   Neurological: She is alert and oriented to person, place, and time. Coordination normal.  Skin: Skin is warm.  Psychiatric: She has a normal mood and affect. Her behavior is normal.  Nursing note and vitals reviewed.   ED Course  .Nerve Block Date/Time: 09/28/2015 9:05 PM Performed by: Anselm Pancoast Authorized by: Anselm Pancoast Consent: Verbal consent obtained. Risks  and benefits: risks, benefits and alternatives were discussed Consent given by: patient Patient understanding: patient states understanding of the procedure being performed Patient consent: the patient's understanding of the procedure matches consent given Procedure consent: procedure consent matches procedure scheduled Patient identity confirmed: verbally with patient and arm band Time out: Immediately prior to procedure a "time out" was called to verify the correct patient, procedure, equipment, support staff and site/side marked as required. Indications: pain relief and fracture Body area: upper extremity Nerve: digital Laterality: left Patient sedated: no Preparation: Patient was prepped and draped in the usual sterile fashion. Patient position: sitting Needle gauge: 25 G Location technique: anatomical landmarks Local anesthetic: bupivacaine 0.5% without epinephrine Anesthetic total: 2 ml Outcome: pain improved Patient tolerance: Patient tolerated the procedure well with no immediate complications  .Splint Application Date/Time: 09/28/2015 9:05 PM Performed by: Anselm Pancoast Authorized by: Anselm Pancoast Consent: Verbal consent obtained. Risks and benefits: risks, benefits and alternatives were discussed Consent given by: patient Patient understanding: patient states understanding of the procedure being performed Patient consent: the patient's understanding of the procedure matches consent given Procedure consent: procedure consent matches procedure scheduled Patient identity confirmed: verbally with patient and arm band Location details: left index finger Splint type: static finger Supplies used: aluminum splint Post-procedure: The splinted body part was neurovascularly unchanged following the procedure. Patient tolerance: Patient tolerated the procedure well with no immediate complications    DIAGNOSTIC STUDIES: Oxygen Saturation is 97% on RA, normal by my interpretation.     COORDINATION OF CARE: 7:42 PM Discussed treatment plan which includes to order splint for left index finger with pt. Will perform digital block for pain management in the ED. Discussed Xray results with pt. Pt acknowledges and agrees to plan.    Dg Hand Complete Left  09/25/2015  CLINICAL DATA:  Left index finger pain with scarring. Closed finger in car door 1 month ago. EXAM: LEFT HAND - COMPLETE 3+ VIEW COMPARISON:  None. FINDINGS: Healing fracture within the middle phalanx of the left index finger, nondisplaced. No subluxation or dislocation. Soft tissues are intact. IMPRESSION: Healing nondisplaced middle phalangeal fracture of the left index finger . Electronically Signed   By: Charlett Nose M.D.   On: 09/25/2015 08:11      MDM   Final diagnoses:  Finger fracture, left, closed, initial encounter    AMOR HYLE presents with continued pain to a known fractured finger for the last month.  Patient X-Ray positive for a healing nondisplaced middle phalangeal fracture of the left index finger. This patient is already being followed by her PCP for this injury. The patient was offered a digital block for pain relief, which she accepted. Finger was splinted, conservative therapy recommended and discussed. Patient to follow up with her PCP. Patient will be discharged home & is agreeable with above plan. Returns precautions discussed. Pt appears Perez for discharge.  I personally performed the services described in this documentation, which  was scribed in my presence. The recorded information has been reviewed and is accurate.    Anselm Pancoast, PA-C 09/28/15 2354  Nelva Nay, MD 10/01/15 [phone]

## 2015-09-28 NOTE — ED Notes (Signed)
Pt sts left index finger injury with fracture per pt and increased pain

## 2015-09-28 NOTE — Discharge Instructions (Signed)
You have been seen today for a finger fracture. Follow up with PCP as it is possible for chronic management and any referrals that need to take place. Return to ED should symptoms worsen.

## 2015-10-09 ENCOUNTER — Encounter: Payer: Self-pay | Admitting: Family Medicine

## 2015-10-09 ENCOUNTER — Ambulatory Visit: Payer: Self-pay | Attending: Family Medicine | Admitting: Family Medicine

## 2015-10-09 VITALS — BP 120/83 | HR 78 | Temp 98.1°F | Resp 15 | Ht 63.0 in | Wt 181.4 lb

## 2015-10-09 DIAGNOSIS — H9201 Otalgia, right ear: Secondary | ICD-10-CM

## 2015-10-09 DIAGNOSIS — E119 Type 2 diabetes mellitus without complications: Secondary | ICD-10-CM

## 2015-10-09 DIAGNOSIS — S62609A Fracture of unspecified phalanx of unspecified finger, initial encounter for closed fracture: Secondary | ICD-10-CM | POA: Insufficient documentation

## 2015-10-09 DIAGNOSIS — E1149 Type 2 diabetes mellitus with other diabetic neurological complication: Secondary | ICD-10-CM

## 2015-10-09 DIAGNOSIS — S62609D Fracture of unspecified phalanx of unspecified finger, subsequent encounter for fracture with routine healing: Secondary | ICD-10-CM

## 2015-10-09 DIAGNOSIS — J309 Allergic rhinitis, unspecified: Secondary | ICD-10-CM | POA: Insufficient documentation

## 2015-10-09 DIAGNOSIS — J3089 Other allergic rhinitis: Secondary | ICD-10-CM

## 2015-10-09 DIAGNOSIS — E1140 Type 2 diabetes mellitus with diabetic neuropathy, unspecified: Secondary | ICD-10-CM | POA: Insufficient documentation

## 2015-10-09 LAB — GLUCOSE, POCT (MANUAL RESULT ENTRY): POC GLUCOSE: 150 mg/dL — AB (ref 70–99)

## 2015-10-09 MED ORDER — METFORMIN HCL 500 MG PO TABS: 500.0000 mg | ORAL_TABLET | Freq: Two times a day (BID) | ORAL | Status: DC

## 2015-10-09 MED ORDER — CETIRIZINE HCL 10 MG PO TABS: 10.0000 mg | ORAL_TABLET | Freq: Every day | ORAL | Status: DC

## 2015-10-09 MED ORDER — LISINOPRIL 2.5 MG PO TABS: 2.5000 mg | ORAL_TABLET | Freq: Every day | ORAL | Status: DC

## 2015-10-09 MED ORDER — OLOPATADINE HCL 0.1 % OP SOLN: 1.0000 [drp] | Freq: Two times a day (BID) | OPHTHALMIC | Status: DC

## 2015-10-09 NOTE — Patient Instructions (Signed)
Allergic Rhinitis Allergic rhinitis is when the mucous membranes in the nose respond to allergens. Allergens are particles in the air that cause your body to have an allergic reaction. This causes you to release allergic antibodies. Through a chain of events, these eventually cause you to release histamine into the blood stream. Although meant to protect the body, it is this release of histamine that causes your discomfort, such as frequent sneezing, congestion, and an itchy, runny nose.  CAUSES Seasonal allergic rhinitis (hay fever) is caused by pollen allergens that may come from grasses, trees, and weeds. Year-round allergic rhinitis (perennial allergic rhinitis) is caused by allergens such as house dust mites, pet dander, and mold spores. SYMPTOMS  Nasal stuffiness (congestion).  Itchy, runny nose with sneezing and tearing of the eyes. DIAGNOSIS Your health care provider can help you determine the allergen or allergens that trigger your symptoms. If you and your health care provider are unable to determine the allergen, skin or blood testing may be used. Your health care provider will diagnose your condition after taking your health history and performing a physical exam. Your health care provider may assess you for other related conditions, such as asthma, pink eye, or an ear infection. TREATMENT Allergic rhinitis does not have a cure, but it can be controlled by:  Medicines that block allergy symptoms. These may include allergy shots, nasal sprays, and oral antihistamines.  Avoiding the allergen. Hay fever may often be treated with antihistamines in pill or nasal spray forms. Antihistamines block the effects of histamine. There are over-the-counter medicines that may help with nasal congestion and swelling around the eyes. Check with your health care provider before taking or giving this medicine. If avoiding the allergen or the medicine prescribed do not work, there are many new medicines  your health care provider can prescribe. Stronger medicine may be used if initial measures are ineffective. Desensitizing injections can be used if medicine and avoidance does not work. Desensitization is when a patient is given ongoing shots until the body becomes less sensitive to the allergen. Make sure you follow up with your health care provider if problems continue. HOME CARE INSTRUCTIONS It is not possible to completely avoid allergens, but you can reduce your symptoms by taking steps to limit your exposure to them. It helps to know exactly what you are allergic to so that you can avoid your specific triggers. SEEK MEDICAL CARE IF:  You have a fever.  You develop a cough that does not stop easily (persistent).  You have shortness of breath.  You start wheezing.  Symptoms interfere with normal daily activities.   This information is not intended to replace advice given to you by your health care provider. Make sure you discuss any questions you have with your health care provider.   Document Released: 04/15/2001 Document Revised: 08/11/2014 Document Reviewed: 03/28/2013 Elsevier Interactive Patient Education 2016 [employer]  

## 2015-10-09 NOTE — Progress Notes (Signed)
Subjective:  Patient ID: Kathy Perez, female    DOB: 29-Dec-1965  Age: 50 y.o. MRN: [id number]  CC: Follow-up   HPI Kathy Perez comes in for follow-up of a  nondisplaced middle phalanx fracture of the left index finger. 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 Last seen by cardiology in 04/2015 (status post cardiac cath in 01/2015-normal coronaries as per cardiology note) and follow-up on an as needed basis was recommended.   She had complained of pain in her left index finger after slamming the door of her, against hand a month ago and had noticed persistent pain in that finger with difficulty bending an x-ray of her left hand revealed above findings. She subsequently presented to the ED where she received a finger splint which she does not use often; takes Ibuprofen for the pain with some improvement.  She complains of right ear pain, tearing in both eyes and right retro-orbital pain. The last few days. Denies sinus tenderness or nasal congestion.  Outpatient Prescriptions Prior to Visit  Medication Sig Dispense Refill  . diclofenac (VOLTAREN) 75 MG EC tablet Take 1 tablet (75 mg total) by mouth 2 (two) times daily. 30 tablet 1  . ibuprofen (ADVIL,MOTRIN) 800 MG tablet Take 1 tablet (800 mg total) by mouth 3 (three) times daily. 21 tablet 0  . nitroGLYCERIN (NITROSTAT) 0.4 MG SL tablet Place 0.4 mg under the tongue. Reported on 09/24/2015    . pravastatin (PRAVACHOL) 40 MG tablet Take 1 tablet (40 mg total) by mouth every evening. 30 tablet 3  . pregabalin (LYRICA) 150 MG capsule Take 1 capsule (150 mg total) by mouth 2 (two) times daily. 180 capsule 3  . lisinopril (PRINIVIL,ZESTRIL) 2.5 MG tablet Take 2 tablets (5 mg total) by mouth daily. 30 tablet 3  . metFORMIN (GLUCOPHAGE) 500 MG tablet Take  1 tablet (500 mg total) by mouth 2 (two) times daily with a meal. 60 tablet 3  . Omega-3 Krill Oil 500 MG CAPS Take by mouth. Reported on 10/09/2015    . UNABLE TO FIND Take by mouth.     No facility-administered medications prior to visit.    ROS Review of Systems Constitutional: Negative for fever, chills, diaphoresis, activity change, appetite change and fatigue. HENT: Positive for R ear pain, neg for nosebleeds, congestion, facial swelling, rhinorrhea, neck pain, neck stiffness and ear discharge.  Eyes: Negative for pain, discharge, redness, itching and visual disturbance; positive for tearing Respiratory: Negative for cough, choking, chest tightness, shortness of breath, wheezing and stridor.  Cardiovascular: Negative for chest pain, palpitations and leg swelling. Gastrointestinal: Negative for abdominal distention. Genitourinary: Negative for dysuria, urgency, frequency, hematuria, flank pain, decreased urine volume, difficulty urinating and dyspareunia.  Musculoskeletal: See history of present illness Neurological: Negative for dizziness, tremors, seizures, syncope, facial asymmetry, speech difficulty, weakness, light-headedness, numbness and headaches.  Hematological: Negative for adenopathy. Does not bruise/bleed easily. Psychiatric/Behavioral: Negative for hallucinations, behavioral problems, confusion, dysphoric mood, decreased concentration and agitation.  Objective:  BP 120/83 mmHg  Pulse 78  Temp(Src) 98.1 F (36.7 C)  Resp 15  Ht 5\' 3"  (1.6 m)  Wt 181 lb 6.4 oz (82.283 kg)  BMI 32.14 kg/m2  SpO2 99%  BP/Weight 10/09/2015 09/28/2015 09/24/2015  Systolic BP 120 124 134  Diastolic BP 83 81 84  Wt. (Lbs) 181.4 - 179  BMI 32.14 - 31.72      Physical Exam Constitutional: Patient appears well-developed and well-nourished. No distress. HENT: Normocephalic, atraumatic, cerumen obscuring right TM, left is normal. Oropharynx is clear and moist.  Eyes: Conjunctivae and EOM are  normal. PERRLA, no scleral icterus. Neck: Normal ROM. Neck supple. No JVD. No tracheal deviation. No thyromegaly. CVS: RRR, S1/S2 +, no murmurs, no gallops, no carotid bruit.  Pulmonary: Effort and breath sounds normal, no stridor, rhonchi, wheezes, rales.  Abdominal: Soft. BS +,  no distension, tenderness, rebound or guarding.  Musculoskeletal: Left index finger with scar on dorsal and volar and associated tenderness on palpation, reduced flexion; able to make a partial fist in the left hand (improved from last visit), right hand is normal.  Lymphadenopathy: No lymphadenopathy noted, cervical, inguinal or axillary Neuro: Alert. Normal reflexes, muscle tone coordination. No cranial nerve deficit. Skin: Skin is warm and dry. No rash noted. Not diaphoretic. No erythema. No pallor. Psychiatric: Normal mood and affect. Behavior, judgment, thought content normal.   CLINICAL DATA: Left index finger pain with scarring. Closed finger in car door 1 month ago.  EXAM: LEFT HAND - COMPLETE 3+ VIEW  COMPARISON: None.  FINDINGS: Healing fracture within the middle phalanx of the left index finger, nondisplaced. No subluxation or dislocation. Soft tissues are intact.  IMPRESSION: Healing nondisplaced middle phalangeal fracture of the left index finger .   Electronically Signed  By: Charlett Nose M.D.  On: 09/25/2015 08:11 Assessment & Plan:   1. Type 2 diabetes mellitus without complication, without long-term current use of insulin (HCC) Controlled with A1c of 5.8 - Glucose (CBG)  2. Type 2 diabetes mellitus with other neurologic complication, without long-term current use of insulin (HCC) - metFORMIN (GLUCOPHAGE) 500 MG tablet; Take 1 tablet (500 mg total) by mouth 2 (two) times daily with a meal.  Dispense: 180 tablet; Refill: 0 - lisinopril (PRINIVIL,ZESTRIL) 2.5 MG tablet; Take 1 tablet (2.5 mg total) by mouth daily.  Dispense: 90 tablet; Refill: 0  3. Finger fracture, left,  with routine healing, subsequent encounter Healing Advised on ROM exercises  4. Otalgia, right No evidence of infection Placed on Zyrtec.  5. Other allergic rhinitis Oral antihistamine and optic drops   Meds ordered this encounter  Medications  . metFORMIN (GLUCOPHAGE) 500 MG tablet    Sig: Take 1 tablet (500 mg total) by mouth 2 (two) times daily with a meal.    Dispense:  180 tablet    Refill:  0  . lisinopril (PRINIVIL,ZESTRIL) 2.5 MG tablet    Sig: Take 1 tablet (2.5 mg total) by mouth daily.    Dispense:  90 tablet    Refill:  0    Discontinue previous dose  . olopatadine (PATANOL) 0.1 % ophthalmic solution    Sig: Place 1 drop into both eyes 2 (two) times daily.    Dispense:  5 mL    Refill:  1  . cetirizine (ZYRTEC) 10 MG tablet    Sig: Take 1 tablet (10 mg total) by mouth daily.    Dispense:  30 tablet    Refill:  1    Follow-up: Return in about 3 months (around 01/09/2016) for follow up on diabetes mellitus.   This note has been created with Education officer, environmental. Any transcriptional errors are unintentional.     Jaclyn Shaggy MD

## 2015-10-09 NOTE — Progress Notes (Signed)
Patient states finger is feeling better Needs refill on her metformin States her left ear is still bothering here and complains of left eye drainage

## 2015-10-10 MED FILL — metFORMIN HCL 500 MG TABS: 500 | 30 days supply | Qty: 60 | Fill #0

## 2015-10-10 MED FILL — LISINOPRIL 2.5 MG TABLET: 2.5 | 30 days supply | Qty: 30 | Fill #0

## 2015-10-10 MED FILL — OLOPATADINE HCL 0.1% EYE DR: 0.1 | 17 days supply | Qty: 5 | Fill #0

## 2015-10-10 MED FILL — ?CETIRIZINE HCL 10 MG TABLE: 10 | 30 days supply | Qty: 30 | Fill #0

## 2015-11-28 MED FILL — OLOPATADINE HCL 0.1% EYE DR: 0.1 | 17 days supply | Qty: 5 | Fill #1

## 2015-11-28 MED FILL — LISINOPRIL 2.5 MG TABLET: 2.5 | 30 days supply | Qty: 30 | Fill #1

## 2015-11-28 MED FILL — ?DICLOFENAC SOD DR 75 MG TA: 75 | 15 days supply | Qty: 30 | Fill #0

## 2015-11-28 MED FILL — metFORMIN HCL 500 MG TABS: 500 | 30 days supply | Qty: 60 | Fill #1

## 2015-11-28 MED FILL — ?CETIRIZINE HCL 10 MG TABLE: 10 | 30 days supply | Qty: 30 | Fill #1

## 2015-11-28 MED FILL — ?PRAVASTATIN NA 40 MG TAB: 40 MG | 30 days supply | Qty: 30 | Fill #0

## 2016-01-02 ENCOUNTER — Other Ambulatory Visit: Payer: Self-pay | Admitting: Family Medicine

## 2016-01-08 MED FILL — ?DICLOFENAC SOD DR 75 MG TA: 75 | 15 days supply | Qty: 30 | Fill #1

## 2016-01-08 MED FILL — ?METFORMIN HCL 500MG TABLET: 500 | 30 days supply | Qty: 60 | Fill #2

## 2016-01-08 MED FILL — LISINOPRIL 2.5 MG TABLET: 2.5 | 30 days supply | Qty: 30 | Fill #2

## 2016-01-08 MED FILL — PRAVASTATIN NA 40 MG TAB: 40 | 30 days supply | Qty: 30 | Fill #1

## 2016-01-14 ENCOUNTER — Ambulatory Visit: Payer: Medicaid Other

## 2016-01-15 ENCOUNTER — Ambulatory Visit: Payer: Medicaid Other | Attending: Family Medicine

## 2016-02-12 MED FILL — DICLOFENAC SOD DR 75 MG TAB: 75 | 30 days supply | Qty: 60 | Fill #1

## 2016-02-12 MED FILL — ?PRAVASTATIN NA 40 MG TAB: 40 MG | 30 days supply | Qty: 30 | Fill #2

## 2016-02-12 MED FILL — ?METFORMIN HCL 500MG TABLET: 500 | 30 days supply | Qty: 60 | Fill #0

## 2016-03-19 MED FILL — ?METFORMIN HCL 500MG TABLET: 500 | 30 days supply | Qty: 60 | Fill #1

## 2016-03-19 MED FILL — ?PRAVASTATIN NA 40 MG TAB: 40 MG | 30 days supply | Qty: 30 | Fill #3

## 2016-03-19 MED FILL — ?DICLOFENAC SOD DR 75 MG TA: 75 | 30 days supply | Qty: 60 | Fill #2

## 2016-04-23 ENCOUNTER — Other Ambulatory Visit: Payer: Self-pay | Admitting: Family Medicine

## 2016-04-23 MED FILL — PRAVASTATIN NA 40 MG TAB: 40 | 30 days supply | Qty: 30 | Fill #1

## 2016-04-23 MED FILL — metFORMIN HCL 500 MG TABS: 500 | 30 days supply | Qty: 60 | Fill #2

## 2016-04-25 ENCOUNTER — Telehealth: Payer: Self-pay | Admitting: Family Medicine

## 2016-04-25 NOTE — Telephone Encounter (Signed)
Medication Refill: pregabalin (LYRICA) 150 MG capsule

## 2016-04-28 ENCOUNTER — Other Ambulatory Visit: Payer: Self-pay | Admitting: Family Medicine

## 2016-04-28 NOTE — Telephone Encounter (Signed)
She is due for an office visit and will receive her refill then.

## 2016-04-28 NOTE — Telephone Encounter (Signed)
This is a controlled substance - will forward to Dr. Amao. 

## 2016-04-29 ENCOUNTER — Telehealth: Payer: Self-pay | Admitting: Family Medicine

## 2016-04-29 NOTE — Telephone Encounter (Signed)
Called patient to inform her that Dr. Venetia NightAmao can see her on 10/3 @ 10:15 am. I couldn't leave msg because vm has not been set up.

## 2016-04-29 NOTE — Telephone Encounter (Signed)
Writer spoke with patient regarding refill request.  Per Dr. Venetia NightAmao patient needs an appt which has been scheduled.

## 2016-05-06 ENCOUNTER — Ambulatory Visit: Payer: Medicaid Other | Admitting: Family Medicine

## 2016-05-13 ENCOUNTER — Encounter: Payer: Self-pay | Admitting: Family Medicine

## 2016-05-13 ENCOUNTER — Ambulatory Visit: Payer: Self-pay | Attending: Family Medicine | Admitting: Family Medicine

## 2016-05-13 VITALS — BP 125/84 | HR 88 | Temp 98.7°F | Ht 62.0 in | Wt 174.0 lb

## 2016-05-13 DIAGNOSIS — Z7984 Long term (current) use of oral hypoglycemic drugs: Secondary | ICD-10-CM | POA: Insufficient documentation

## 2016-05-13 DIAGNOSIS — J039 Acute tonsillitis, unspecified: Secondary | ICD-10-CM

## 2016-05-13 DIAGNOSIS — E7800 Pure hypercholesterolemia, unspecified: Secondary | ICD-10-CM

## 2016-05-13 DIAGNOSIS — R0789 Other chest pain: Secondary | ICD-10-CM

## 2016-05-13 DIAGNOSIS — E119 Type 2 diabetes mellitus without complications: Secondary | ICD-10-CM | POA: Insufficient documentation

## 2016-05-13 DIAGNOSIS — B353 Tinea pedis: Secondary | ICD-10-CM

## 2016-05-13 DIAGNOSIS — E1149 Type 2 diabetes mellitus with other diabetic neurological complication: Secondary | ICD-10-CM

## 2016-05-13 DIAGNOSIS — Z79899 Other long term (current) drug therapy: Secondary | ICD-10-CM | POA: Insufficient documentation

## 2016-05-13 DIAGNOSIS — M797 Fibromyalgia: Secondary | ICD-10-CM

## 2016-05-13 LAB — POCT GLYCOSYLATED HEMOGLOBIN (HGB A1C): Hemoglobin A1C: 6.3

## 2016-05-13 LAB — GLUCOSE, POCT (MANUAL RESULT ENTRY): POC Glucose: 97 mg/dl (ref 70–99)

## 2016-05-13 MED ORDER — PRAVASTATIN SODIUM 40 MG PO TABS: 40.0000 mg | ORAL_TABLET | Freq: Every evening | ORAL | 3 refills | Status: DC

## 2016-05-13 MED ORDER — AMOXICILLIN 500 MG PO CAPS: 500.0000 mg | ORAL_CAPSULE | Freq: Three times a day (TID) | ORAL | 0 refills | Status: DC

## 2016-05-13 MED ORDER — METFORMIN HCL 500 MG PO TABS: 500.0000 mg | ORAL_TABLET | Freq: Two times a day (BID) | ORAL | 1 refills | Status: DC

## 2016-05-13 MED ORDER — PREGABALIN 150 MG PO CAPS: 150.0000 mg | ORAL_CAPSULE | Freq: Two times a day (BID) | ORAL | 3 refills | Status: DC

## 2016-05-13 MED ORDER — DICLOFENAC SODIUM 75 MG PO TBEC: 75.0000 mg | DELAYED_RELEASE_TABLET | Freq: Two times a day (BID) | ORAL | 3 refills | Status: DC

## 2016-05-13 MED ORDER — TERBINAFINE HCL 1 % EX CREA: 1.0000 "application " | TOPICAL_CREAM | Freq: Two times a day (BID) | CUTANEOUS | 1 refills | Status: DC

## 2016-05-13 MED FILL — ?PRAVASTATIN NA 40 MG TAB: 40 MG | 30 days supply | Qty: 30 | Fill #0

## 2016-05-13 MED FILL — AMOXICILLIN 500 MG CAPSULE: 500 | 10 days supply | Qty: 30 | Fill #0

## 2016-05-13 MED FILL — ?DICLOFENAC SOD DR 75 MG TA: 75 | 15 days supply | Qty: 30 | Fill #0

## 2016-05-13 MED FILL — metFORMIN HCL 500 MG TABS: 500 | 30 days supply | Qty: 60 | Fill #0

## 2016-05-13 NOTE — Progress Notes (Signed)
Stopped lisinopril on own- states her BP is low and she is dizzy Medication refills

## 2016-05-14 NOTE — Progress Notes (Signed)
Subjective:  Patient ID: Kathy Perez, female    DOB: June 29, 1966  Age: 50 y.o. MRN: [id number]  CC: Sore Throat; Otalgia; Cough (productive-dk green/yellow); Shortness of Breath; Headache; Diabetes; Chest Pain; and Allergic Rhinitis    HPI Kathy Perez comes in for follow-up of a nondisplaced middle phalanx fracture of the left index finger. 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 Last seen by cardiology in 04/2015 (status post cardiac cath in 01/2015 - normal coronaries as per cardiology note) and follow-up on an as needed basis was recommended.  She complains of sinus pressure, nasal congestion, cough, postnasal drip, sore throat, ears popping, shortness of breath for the last few weeks with associated myalgias and has not used any over-the-counter medications. Denies fever.  She has intermittent chest pains which does not radiate and denies wheezing but pain is absent at this time. Complains of splitting of the skin in between webspaces. She is requesting refills of her medications today.   Past Medical History:  Diagnosis Date  . Diabetes mellitus without complication (HCC)     History reviewed. No pertinent surgical history.  No Known Allergies   Outpatient Medications Prior to Visit  Medication Sig Dispense Refill  . Omega-3 Krill Oil 500 MG CAPS Take by mouth. Reported on 10/09/2015    . ibuprofen (ADVIL,MOTRIN) 800 MG tablet Take 1 tablet (800 mg total) by mouth 3 (three) times daily. 21 tablet 0  . metFORMIN (GLUCOPHAGE) 500 MG tablet Take 1 tablet (500 mg total) by mouth 2 (two) times daily with a meal. 180 tablet 0  . pravastatin (PRAVACHOL) 40 MG tablet Take 1 tablet (40 mg total) by mouth every evening. 30 tablet 3  . pregabalin (LYRICA) 150 MG capsule Take 1 capsule  (150 mg total) by mouth 2 (two) times daily. 180 capsule 3  . cetirizine (ZYRTEC) 10 MG tablet Take 1 tablet (10 mg total) by mouth daily. (Patient not taking: Reported on 05/13/2016) 30 tablet 1  . nitroGLYCERIN (NITROSTAT) 0.4 MG SL tablet Place 0.4 mg under the tongue. Reported on 09/24/2015    . olopatadine (PATANOL) 0.1 % ophthalmic solution Place 1 drop into both eyes 2 (two) times daily. (Patient not taking: Reported on 05/13/2016) 5 mL 1  . UNABLE TO FIND Take by mouth.    . diclofenac (VOLTAREN) 75 MG EC tablet Take 1 tablet (75 mg total) by mouth 2 (two) times daily. (Patient not taking: Reported on 05/13/2016) 30 tablet 1  . lisinopril (PRINIVIL,ZESTRIL) 2.5 MG tablet Take 1 tablet (2.5 mg total) by mouth daily. (Patient not taking: Reported on 05/13/2016) 90 tablet 0   No facility-administered medications prior to visit.     ROS Review of Systems  Constitutional: Negative for activity change, appetite change and fatigue.  HENT:       See history of present illness  Eyes: Negative for visual disturbance.  Respiratory: Positive for shortness of breath. Negative for cough, chest tightness and wheezing.   Cardiovascular: Negative for chest pain and palpitations.  Gastrointestinal: Negative for abdominal distention, abdominal pain and constipation.  Endocrine: Negative for polydipsia.  Genitourinary: Negative for dysuria and frequency.  Musculoskeletal: Negative for arthralgias and back pain.  Skin: Negative for rash.  Neurological: Negative for tremors, light-headedness and numbness.  Hematological: Does not bruise/bleed easily.  Psychiatric/Behavioral: Negative for agitation  and behavioral problems.    Objective:  BP 125/84 (BP Location: Right Arm, Patient Position: Sitting, Cuff Size: Large)   Pulse 88   Temp 98.7 F (37.1 C) (Oral)   Ht 5\' 2"  (1.575 m)   Wt 174 lb (78.9 kg)   SpO2 91%   BMI 31.83 kg/m   BP/Weight 05/13/2016 10/09/2015 09/28/2015  Systolic BP 125 120  124  Diastolic BP 84 83 81  Wt. (Lbs) 174 181.4 -  BMI 31.83 32.14 -      Physical Exam  Constitutional: She is oriented to person, place, and time. She appears well-developed and well-nourished.  HENT:  Right Ear: External ear normal.  Left Ear: External ear normal.  Right tonsillar hypertrophy and erythema; left tonsil is normal  Cardiovascular: Normal rate, normal heart sounds and intact distal pulses.   No murmur heard. Pulmonary/Chest: Effort normal and breath sounds normal. She has no wheezes. She has no rales. She exhibits no tenderness.  Abdominal: Soft. Bowel sounds are normal. She exhibits no distension and no mass. There is no tenderness.  Musculoskeletal: Normal range of motion.  Neurological: She is alert and oriented to person, place, and time.  Skin:  Splitting of skin in webspaces with associated erythema and some evidence of tinea pedis    CMP Latest Ref Rng & Units 06/18/2015  Glucose 65 - 99 mg/dL 409(W)  BUN 7 - 25 mg/dL 8  Creatinine 1.19 - 1.47 mg/dL 8.29  Sodium 562 - 130 mmol/L 140  Potassium 3.5 - 5.3 mmol/L 4.5  Chloride 98 - 110 mmol/L 104  CO2 20 - 31 mmol/L 24  Calcium 8.6 - 10.4 mg/dL 9.5  Total Protein 6.1 - 8.1 g/dL 7.2  Total Bilirubin 0.2 - 1.2 mg/dL 0.4  Alkaline Phos 33 - 130 U/L 54  AST 10 - 35 U/L 20  ALT 6 - 29 U/L 25    Lipid Panel     Component Value Date/Time   CHOL 171 06/18/2015 1020   TRIG 192 (H) 06/18/2015 1020   HDL 43 (L) 06/18/2015 1020   CHOLHDL 4.0 06/18/2015 1020   VLDL 38 (H) 06/18/2015 1020   LDLCALC 90 06/18/2015 1020    Lab Results  Component Value Date   HGBA1C 6.3 05/13/2016    Assessment & Plan:   1. Pure hypercholesterolemia Controlled Slight hypertriglyceridemia Low-cholesterol diet, exercise - pravastatin (PRAVACHOL) 40 MG tablet; Take 1 tablet (40 mg total) by mouth every evening.  Dispense: 30 tablet; Refill: 3  2. Type 2 diabetes mellitus with other neurologic complication, without  long-term current use of insulin (HCC) Controlled with A1c of 6.3 No regimen change at this time - metFORMIN (GLUCOPHAGE) 500 MG tablet; Take 1 tablet (500 mg total) by mouth 2 (two) times daily with a meal.  Dispense: 180 tablet; Refill: 1 - Lipid panel; Future - COMPLETE METABOLIC PANEL WITH GFR; Future - Microalbumin / creatinine urine ratio; Future - Glucose (CBG) - HgB A1c  3. Fibromyalgia Stable - pregabalin (LYRICA) 150 MG capsule; Take 1 capsule (150 mg total) by mouth 2 (two) times daily.  Dispense: 180 capsule; Refill: 3 - diclofenac (VOLTAREN) 75 MG EC tablet; Take 1 tablet (75 mg total) by mouth 2 (two) times daily.  Dispense: 30 tablet; Refill: 3  4. Other chest pain EKG is within normal limits Previously followed by [facility] cardiology. If pain persist she will need to be referred back to her cardiologist for a stress test.  5. Tinea pedis of both feet -  terbinafine (LAMISIL AT) 1 % cream; Apply 1 application topically 2 (two) times daily.  Dispense: 30 g; Refill: 1  6. Tonsillitis - amoxicillin (AMOXIL) 500 MG capsule; Take 1 capsule (500 mg total) by mouth 3 (three) times daily.  Dispense: 30 capsule; Refill: 0   Meds ordered this encounter  Medications  . pregabalin (LYRICA) 150 MG capsule    Sig: Take 1 capsule (150 mg total) by mouth 2 (two) times daily.    Dispense:  180 capsule    Refill:  3  . pravastatin (PRAVACHOL) 40 MG tablet    Sig: Take 1 tablet (40 mg total) by mouth every evening.    Dispense:  30 tablet    Refill:  3  . metFORMIN (GLUCOPHAGE) 500 MG tablet    Sig: Take 1 tablet (500 mg total) by mouth 2 (two) times daily with a meal.    Dispense:  180 tablet    Refill:  1  . diclofenac (VOLTAREN) 75 MG EC tablet    Sig: Take 1 tablet (75 mg total) by mouth 2 (two) times daily.    Dispense:  30 tablet    Refill:  3  . terbinafine (LAMISIL AT) 1 % cream    Sig: Apply 1 application topically 2 (two) times daily.    Dispense:  30 g     Refill:  1  . amoxicillin (AMOXIL) 500 MG capsule    Sig: Take 1 capsule (500 mg total) by mouth 3 (three) times daily.    Dispense:  30 capsule    Refill:  0    Follow-up: Return in about 6 months (around 11/11/2016) for follow up on diabetes mellitus.   Jaclyn Shaggy MD

## 2016-05-15 ENCOUNTER — Ambulatory Visit: Payer: Medicaid Other | Attending: Family Medicine

## 2016-05-15 DIAGNOSIS — E1149 Type 2 diabetes mellitus with other diabetic neurological complication: Secondary | ICD-10-CM

## 2016-05-15 LAB — LIPID PANEL
CHOL/HDL RATIO: 4.5 ratio (ref <=5.0)
Cholesterol: 171 mg/dL (ref 125–200)
HDL: 38 mg/dL — ABNORMAL LOW (ref >=46)
LDL Cholesterol: 105 mg/dL (ref <130)
Triglycerides: 140 mg/dL (ref <150)
VLDL: 28 mg/dL (ref <30)

## 2016-05-15 LAB — COMPLETE METABOLIC PANEL WITH GFR
ALT: 16 U/L (ref 6–29)
AST: 18 U/L (ref 10–35)
Albumin: 3.9 g/dL (ref 3.6–5.1)
Alkaline Phosphatase: 61 U/L (ref 33–130)
BUN: 6 mg/dL — AB (ref 7–25)
CHLORIDE: 99 mmol/L (ref 98–110)
CO2: 27 mmol/L (ref 20–31)
Calcium: 9.6 mg/dL (ref 8.6–10.4)
Creat: 0.6 mg/dL (ref 0.50–1.05)
GFR, Est African American: >89 mL/min (ref >=60)
GFR, Est Non African American: >89 mL/min (ref >=60)
Glucose, Bld: 123 mg/dL — ABNORMAL HIGH (ref 65–99)
POTASSIUM: 4.4 mmol/L (ref 3.5–5.3)
SODIUM: 140 mmol/L (ref 135–146)
Total Bilirubin: 0.3 mg/dL (ref 0.2–1.2)
Total Protein: 7.3 g/dL (ref 6.1–8.1)

## 2016-05-15 NOTE — Progress Notes (Signed)
Patient here for lab visit only 

## 2016-05-16 LAB — MICROALBUMIN / CREATININE URINE RATIO
CREATININE, URINE: 56 mg/dL (ref 20–320)
Microalb Creat Ratio: 9 mcg/mg creat (ref <30)
Microalb, Ur: 0.5 mg/dL

## 2016-05-22 ENCOUNTER — Telehealth: Payer: Self-pay

## 2016-05-22 NOTE — Telephone Encounter (Signed)
Writer called patient with results per Dr. Venetia NightAmao.  Patient stated understanding that all her labs came back normal.

## 2016-05-22 NOTE — Telephone Encounter (Signed)
-----   Message from Jaclyn ShaggyEnobong Amao, MD sent at 05/16/2016  1:54 PM EDT ----- Please inform the patient that labs are normal. Thank you.

## 2016-05-26 ENCOUNTER — Ambulatory Visit: Payer: Medicaid Other | Admitting: Family Medicine

## 2016-06-20 MED FILL — ?DICLOFENAC SOD DR 75 MG TA: 75 | 15 days supply | Qty: 30 | Fill #1

## 2016-06-24 MED FILL — ?METFORMIN HCL 500MG TABLET: 500 | 30 days supply | Qty: 60 | Fill #3

## 2016-07-22 ENCOUNTER — Other Ambulatory Visit: Payer: Self-pay | Admitting: Family Medicine

## 2016-07-22 DIAGNOSIS — E1149 Type 2 diabetes mellitus with other diabetic neurological complication: Secondary | ICD-10-CM

## 2016-07-22 MED FILL — ?PRAVASTATIN NA 40 MG TAB: 40 MG | 30 days supply | Qty: 30 | Fill #1

## 2016-07-22 MED FILL — DICLOFENAC SOD DR 75 MG TAB: 75 | 15 days supply | Qty: 30 | Fill #2

## 2016-08-08 MED FILL — metFORMIN HCL 500 MG TABS: 500 | 30 days supply | Qty: 60 | Fill #1

## 2016-08-26 ENCOUNTER — Other Ambulatory Visit: Payer: Self-pay | Admitting: Family Medicine

## 2016-08-26 DIAGNOSIS — E1149 Type 2 diabetes mellitus with other diabetic neurological complication: Secondary | ICD-10-CM

## 2016-08-26 MED FILL — DICLOFENAC SOD DR 75 MG TAB: 75 | 15 days supply | Qty: 30 | Fill #3

## 2016-08-26 MED FILL — PRAVASTATIN NA 40 MG TAB: 40 | 30 days supply | Qty: 30 | Fill #2

## 2016-09-03 ENCOUNTER — Other Ambulatory Visit: Payer: Self-pay | Admitting: Family Medicine

## 2016-09-03 DIAGNOSIS — Z1231 Encounter for screening mammogram for malignant neoplasm of breast: Secondary | ICD-10-CM

## 2016-09-09 MED FILL — metFORMIN HCL 500 MG TABS: 500 | 30 days supply | Qty: 60 | Fill #0

## 2016-09-10 ENCOUNTER — Ambulatory Visit: Payer: Medicaid Other

## 2016-09-10 ENCOUNTER — Other Ambulatory Visit: Payer: Self-pay | Admitting: Family Medicine

## 2016-09-10 DIAGNOSIS — Z1231 Encounter for screening mammogram for malignant neoplasm of breast: Secondary | ICD-10-CM

## 2016-09-17 ENCOUNTER — Ambulatory Visit
Admission: RE | Admit: 2016-09-17 | Discharge: 2016-09-17 | Disposition: A | Discharge status: Home / Self Care | Payer: No Typology Code available for payment source | Source: Ambulatory Visit | Attending: Family Medicine | Admitting: Family Medicine

## 2016-09-17 DIAGNOSIS — Z1231 Encounter for screening mammogram for malignant neoplasm of breast: Secondary | ICD-10-CM

## 2016-09-23 ENCOUNTER — Other Ambulatory Visit: Payer: Self-pay | Admitting: Family Medicine

## 2016-09-23 DIAGNOSIS — M797 Fibromyalgia: Secondary | ICD-10-CM

## 2016-09-23 DIAGNOSIS — E1149 Type 2 diabetes mellitus with other diabetic neurological complication: Secondary | ICD-10-CM

## 2016-09-23 MED FILL — ?PRAVASTATIN NA 40 MG TAB: 40 MG | 30 days supply | Qty: 30 | Fill #2

## 2016-09-23 MED FILL — DICLOFENAC SOD DR 75 MG TAB: 75 | 30 days supply | Qty: 60 | Fill #0

## 2016-09-23 MED FILL — PRAVASTATIN NA 40 MG TAB: 40 | 30 days supply | Qty: 30 | Fill #0

## 2016-09-23 MED FILL — metFORMIN HCL 500 MG TABS: 500 | 30 days supply | Qty: 60 | Fill #1

## 2016-09-25 ENCOUNTER — Telehealth: Payer: Self-pay | Admitting: *Deleted

## 2016-09-25 NOTE — Telephone Encounter (Signed)
Patient verified DOB Patient is aware of mammogram being negative for malignancy. Patient expressed her understanding and had no further questions at this time.

## 2016-09-25 NOTE — Telephone Encounter (Signed)
-----   Message from Jaclyn ShaggyEnobong Amao, MD sent at 09/18/2016  1:11 PM EST ----- Mammogram is negative for malignancy

## 2016-12-02 ENCOUNTER — Other Ambulatory Visit: Payer: Self-pay | Admitting: Family Medicine

## 2016-12-02 DIAGNOSIS — M797 Fibromyalgia: Secondary | ICD-10-CM

## 2016-12-04 MED FILL — ?DICLOFENAC SOD DR 75 MG TA: 75 | 15 days supply | Qty: 30 | Fill #0

## 2016-12-05 ENCOUNTER — Ambulatory Visit: Payer: Self-pay | Attending: Internal Medicine | Admitting: Physician Assistant

## 2016-12-05 ENCOUNTER — Encounter: Payer: Self-pay | Admitting: Physician Assistant

## 2016-12-05 VITALS — BP 111/82 | HR 87 | Temp 98.2°F | Resp 16 | Wt 176.4 lb

## 2016-12-05 DIAGNOSIS — M797 Fibromyalgia: Secondary | ICD-10-CM | POA: Insufficient documentation

## 2016-12-05 DIAGNOSIS — J321 Chronic frontal sinusitis: Secondary | ICD-10-CM | POA: Insufficient documentation

## 2016-12-05 DIAGNOSIS — Z7984 Long term (current) use of oral hypoglycemic drugs: Secondary | ICD-10-CM | POA: Insufficient documentation

## 2016-12-05 DIAGNOSIS — E7800 Pure hypercholesterolemia, unspecified: Secondary | ICD-10-CM | POA: Insufficient documentation

## 2016-12-05 DIAGNOSIS — Z79899 Other long term (current) drug therapy: Secondary | ICD-10-CM | POA: Insufficient documentation

## 2016-12-05 DIAGNOSIS — E1149 Type 2 diabetes mellitus with other diabetic neurological complication: Secondary | ICD-10-CM | POA: Insufficient documentation

## 2016-12-05 LAB — GLUCOSE, POCT (MANUAL RESULT ENTRY): POC Glucose: 130 mg/dl — AB (ref 70–99)

## 2016-12-05 LAB — POCT GLYCOSYLATED HEMOGLOBIN (HGB A1C): HEMOGLOBIN A1C: 6.2

## 2016-12-05 MED ORDER — CETIRIZINE HCL 10 MG PO TABS: 10.0000 mg | ORAL_TABLET | Freq: Every day | ORAL | 1 refills | Status: DC

## 2016-12-05 MED ORDER — DICLOFENAC SODIUM 75 MG PO TBEC: 75.0000 mg | DELAYED_RELEASE_TABLET | Freq: Two times a day (BID) | ORAL | 0 refills | Status: DC

## 2016-12-05 MED ORDER — PREGABALIN 150 MG PO CAPS: 150.0000 mg | ORAL_CAPSULE | Freq: Two times a day (BID) | ORAL | 3 refills | Status: DC

## 2016-12-05 MED ORDER — AMOXICILLIN 500 MG PO CAPS: 500.0000 mg | ORAL_CAPSULE | Freq: Three times a day (TID) | ORAL | 0 refills | Status: DC

## 2016-12-05 MED ORDER — FLUTICASONE PROPIONATE 50 MCG/ACT NA SUSP: 2.0000 | Freq: Every day | NASAL | 6 refills | Status: DC

## 2016-12-05 MED ORDER — METFORMIN HCL 500 MG PO TABS
ORAL_TABLET | ORAL | 0 refills | Status: DC
Start: 2016-12-05 — End: 2017-03-19

## 2016-12-05 MED ORDER — PRAVASTATIN SODIUM 40 MG PO TABS: 40.0000 mg | ORAL_TABLET | Freq: Every evening | ORAL | 3 refills | Status: DC

## 2016-12-05 MED FILL — ?METFORMIN HCL 500MG TABLET: 500 | 30 days supply | Qty: 60 | Fill #0

## 2016-12-05 MED FILL — AMOXICILLIN 500 MG CAPSULE: 500 | 10 days supply | Qty: 30 | Fill #0

## 2016-12-05 MED FILL — ?CETIRIZINE HCL 10 MG TABLE: 10 | 30 days supply | Qty: 30 | Fill #0

## 2016-12-05 MED FILL — FLUTICASONE PROP 50 MCG SPR: 50 | 30 days supply | Qty: 16 | Fill #0

## 2016-12-05 MED FILL — PRAVASTATIN NA 40 MG TAB: 40 | 30 days supply | Qty: 30 | Fill #0

## 2016-12-05 NOTE — Progress Notes (Signed)
Kathy Perez, is a 51 y.o. female  ZOX:[id number]  [id number]  DOB - Nov 22, 1965  Subjective:  Chief Complaint and HPI: Kathy Perez is a 51 y.o. female here today for 1 week h/o sinus pressure and congestion.  Mucus is thick and green.  +mild cough.  Some sneezing and post nasal drip with Sore throat.  + ear congestion and mild pain.  No f/c.  Also, needs RF on all meds.  Rarely checks blood sugar at home.  Says she is too "lazy."  She denies S/sx hyper/hypoglycemia.  ROS:   Constitutional:  No f/c, No night sweats, No unexplained weight loss. EENT:  No vision changes, No blurry vision, No hearing changes. +sinus symptoms  Respiratory: +mild cough, no SOB Cardiac: No CP, no palpitations GI:  No abd pain, No N/V/D. GU: No Urinary s/sx Musculoskeletal: fibromyalgia pain controlled on current regimen Neuro: No headache, no dizziness, no motor weakness.  Skin: No rash Endocrine:  No polydipsia. No polyuria.  Psych: Denies SI/HI  No problems updated.  ALLERGIES: No Known Allergies  PAST MEDICAL HISTORY: Past Medical History:  Diagnosis Date  . Diabetes mellitus without complication (HCC)     MEDICATIONS AT HOME: Prior to Admission medications   Medication Sig Start Date End Date Taking? Authorizing Provider  amoxicillin (AMOXIL) 500 MG capsule Take 1 capsule (500 mg total) by mouth 3 (three) times daily. 12/05/16   Anders Simmonds, PA-C  cetirizine (ZYRTEC) 10 MG tablet Take 1 tablet (10 mg total) by mouth daily. 12/05/16   Anders Simmonds, PA-C  diclofenac (VOLTAREN) 75 MG EC tablet Take 1 tablet (75 mg total) by mouth 2 (two) times daily. 12/05/16   Anders Simmonds, PA-C  fluticasone (FLONASE) 50 MCG/ACT nasal spray Place 2 sprays into both nostrils daily. 12/05/16   Anders Simmonds, PA-C  metFORMIN (GLUCOPHAGE) 500 MG tablet TAKE 1 TABLET BY MOUTH 2 TIMES DAILY WITH A MEAL. 12/05/16   Anders Simmonds, PA-C  nitroGLYCERIN (NITROSTAT) 0.4 MG SL tablet Place 0.4 mg under the  tongue. Reported on 09/24/2015 12/29/14 12/29/15  Historical Provider, MD  olopatadine (PATANOL) 0.1 % ophthalmic solution Place 1 drop into both eyes 2 (two) times daily. Patient not taking: Reported on 05/13/2016 10/09/15   Jaclyn Shaggy, MD  Omega-3 Krill Oil 500 MG CAPS Take by mouth. Reported on 10/09/2015    Historical Provider, MD  pravastatin (PRAVACHOL) 40 MG tablet Take 1 tablet (40 mg total) by mouth every evening. 12/05/16   Anders Simmonds, PA-C  pregabalin (LYRICA) 150 MG capsule Take 1 capsule (150 mg total) by mouth 2 (two) times daily. 12/05/16   Anders Simmonds, PA-C  terbinafine (LAMISIL AT) 1 % cream Apply 1 application topically 2 (two) times daily. 05/13/16   Jaclyn Shaggy, MD  UNABLE TO FIND Take by mouth.    Historical Provider, MD     Objective:  EXAM:   Vitals:   12/05/16 1308  BP: 111/82  Pulse: 87  Resp: 16  Temp: 98.2 F (36.8 C)  TempSrc: Oral  SpO2: 95%  Weight: 176 lb 6.4 oz (80 kg)    General appearance : A&OX3. NAD. Non-toxic-appearing HEENT: Atraumatic and Normocephalic.  PERRLA. EOM intact.  TM clear full BO but no erythema/infection.  Turbinates enlarged and inflamed. Mouth-MMM, post pharynx WNL w/o erythema, + PND. Neck: supple, no JVD. No cervical lymphadenopathy. No thyromegaly Chest/Lungs:  Breathing-non-labored, Good air entry bilaterally, breath sounds normal without rales, rhonchi, or wheezing  CVS: S1  S2 regular, no murmurs, gallops, rubs  Extremities: Bilateral Lower Ext shows no edema, both legs are warm to touch with = pulse throughout Neurology:  CN II-XII grossly intact, Non focal.   Psych:  TP linear. J/I WNL. Normal speech. Appropriate eye contact and affect.  Skin:  No Rash  Data Review Lab Results  Component Value Date   HGBA1C 6.2 12/05/2016   HGBA1C 6.3 05/13/2016   HGBA1C 5.80 09/24/2015     Assessment & Plan   1. Type 2 diabetes mellitus with other neurologic complication, without long-term current use of insulin  (HCC) Slightly improved/stable - HgB A1c - POCT glucose (manual entry) - metFORMIN (GLUCOPHAGE) 500 MG tablet; TAKE 1 TABLET BY MOUTH 2 TIMES DAILY WITH A MEAL.  Dispense: 180 tablet; Refill: 0 - Comprehensive metabolic panel  2. Fibromyalgia - diclofenac (VOLTAREN) 75 MG EC tablet; Take 1 tablet (75 mg total) by mouth 2 (two) times daily.  Dispense: 60 tablet; Refill: 0 - pregabalin (LYRICA) 150 MG capsule; Take 1 capsule (150 mg total) by mouth 2 (two) times daily.  Dispense: 180 capsule; Refill: 3  3. Frontal sinusitis, unspecified chronicity - cetirizine (ZYRTEC) 10 MG tablet; Take 1 tablet (10 mg total) by mouth daily.  Dispense: 30 tablet; Refill: 1 - amoxicillin (AMOXIL) 500 MG capsule; Take 1 capsule (500 mg total) by mouth 3 (three) times daily.  Dispense: 30 capsule; Refill: 0 - fluticasone (FLONASE) 50 MCG/ACT nasal spray; Place 2 sprays into both nostrils daily.  Dispense: 16 g; Refill: 6  4. Pure hypercholesterolemia Continue pravastatin and heart healthy diet - Lipid panel   Patient have been counseled extensively about nutrition and exercise  Return in about 3 months (around 03/07/2017) for Dr Venetia NightAmao; DM and hyperlipidemia.  The patient was given clear instructions to go to ER or return to medical center if symptoms don't improve, worsen or new problems develop. The patient verbalized understanding. The patient was told to call to get lab results if they haven't heard anything in the next week.     Georgian CoAngela Christi Wirick, PA-C [facility] [place] [phone]   12/05/2016, 1:20 PM

## 2016-12-06 LAB — COMPREHENSIVE METABOLIC PANEL
ALK PHOS: 68 IU/L (ref 39–117)
ALT: 18 IU/L (ref 0–32)
AST: 19 IU/L (ref 0–40)
Albumin/Globulin Ratio: 1.4 (ref 1.2–2.2)
Albumin: 4.2 g/dL (ref 3.5–5.5)
BUN/Creatinine Ratio: 11 (ref 9–23)
BUN: 7 mg/dL (ref 6–24)
Bilirubin Total: <0.2 mg/dL (ref 0.0–1.2)
CO2: 28 mmol/L (ref 18–29)
CREATININE: 0.62 mg/dL (ref 0.57–1.00)
Calcium: 10.1 mg/dL (ref 8.7–10.2)
Chloride: 97 mmol/L (ref 96–106)
GFR calc Af Amer: 120 mL/min/{1.73_m2} (ref >59)
GFR calc non Af Amer: 104 mL/min/{1.73_m2} (ref >59)
GLOBULIN, TOTAL: 3 g/dL (ref 1.5–4.5)
Glucose: 105 mg/dL — ABNORMAL HIGH (ref 65–99)
POTASSIUM: 4.3 mmol/L (ref 3.5–5.2)
SODIUM: 140 mmol/L (ref 134–144)
Total Protein: 7.2 g/dL (ref 6.0–8.5)

## 2016-12-06 LAB — LIPID PANEL
CHOLESTEROL TOTAL: 220 mg/dL — AB (ref 100–199)
Chol/HDL Ratio: 4.4 ratio (ref 0.0–4.4)
HDL: 50 mg/dL (ref >39)
LDL CALC: 131 mg/dL — AB (ref 0–99)
TRIGLYCERIDES: 197 mg/dL — AB (ref 0–149)
VLDL Cholesterol Cal: 39 mg/dL (ref 5–40)

## 2016-12-09 ENCOUNTER — Telehealth: Payer: Self-pay

## 2016-12-09 NOTE — Telephone Encounter (Signed)
Contacted pt to go over lab results pt is aware and doesn't have any questions or concerns 

## 2016-12-26 ENCOUNTER — Other Ambulatory Visit: Payer: Self-pay | Admitting: Physician Assistant

## 2016-12-26 DIAGNOSIS — J321 Chronic frontal sinusitis: Secondary | ICD-10-CM

## 2017-01-13 ENCOUNTER — Ambulatory Visit: Payer: Self-pay | Attending: Family Medicine

## 2017-01-13 MED FILL — ?DICLOFENAC SOD DR 75 MG TA: 75 | 30 days supply | Qty: 60 | Fill #0

## 2017-01-13 MED FILL — ?CETIRIZINE HCL 10 MG TABLE: 10 | 30 days supply | Qty: 30 | Fill #0

## 2017-01-19 MED FILL — ?METFORMIN HCL 500MG TABLET: 500 | 30 days supply | Qty: 60 | Fill #1

## 2017-02-09 ENCOUNTER — Other Ambulatory Visit: Payer: Self-pay | Admitting: *Deleted

## 2017-02-09 DIAGNOSIS — M797 Fibromyalgia: Secondary | ICD-10-CM

## 2017-02-09 MED ORDER — PREGABALIN 150 MG PO CAPS: 150.0000 mg | ORAL_CAPSULE | Freq: Two times a day (BID) | ORAL | 3 refills | Status: DC

## 2017-02-09 NOTE — Telephone Encounter (Signed)
PRINTED FOR PASS PROGRAM 

## 2017-02-12 ENCOUNTER — Other Ambulatory Visit: Payer: Self-pay | Admitting: Family Medicine

## 2017-02-12 DIAGNOSIS — J321 Chronic frontal sinusitis: Secondary | ICD-10-CM

## 2017-02-12 MED FILL — ?DICLOFENAC SOD DR 75 MG TA: 75 | 15 days supply | Qty: 30 | Fill #1

## 2017-02-12 MED FILL — PRAVASTATIN NA 40 MG TAB: 40 | 30 days supply | Qty: 30 | Fill #1

## 2017-02-12 MED FILL — ?CETIRIZINE HCL 10 MG TABLE: 10 | 30 days supply | Qty: 30 | Fill #1

## 2017-02-12 MED FILL — ?METFORMIN HCL 500MG TABLET: 500 | 30 days supply | Qty: 60 | Fill #2

## 2017-03-10 ENCOUNTER — Other Ambulatory Visit: Payer: Self-pay | Admitting: *Deleted

## 2017-03-10 MED ORDER — GABAPENTIN 300 MG PO CAPS: 300.0000 mg | ORAL_CAPSULE | Freq: Every day | ORAL | 3 refills | Status: DC

## 2017-03-10 NOTE — Telephone Encounter (Signed)
Gabapentin ordered while PASS is being processed for Lyrica.

## 2017-03-19 ENCOUNTER — Other Ambulatory Visit: Payer: Self-pay | Admitting: Physician Assistant

## 2017-03-19 DIAGNOSIS — E1149 Type 2 diabetes mellitus with other diabetic neurological complication: Secondary | ICD-10-CM

## 2017-03-19 MED FILL — ?CETIRIZINE HCL 10 MG TABLE: 10 | 30 days supply | Qty: 30 | Fill #0

## 2017-03-19 MED FILL — PRAVASTATIN NA 40 MG TAB: 40 | 30 days supply | Qty: 30 | Fill #2

## 2017-03-19 MED FILL — ?METFORMIN HCL 500MG TABLET: 500 | 30 days supply | Qty: 60 | Fill #0

## 2017-03-19 MED FILL — ?DICLOFENAC SOD DR 75 MG TA: 75 | 15 days supply | Qty: 30 | Fill #2

## 2017-04-02 ENCOUNTER — Ambulatory Visit: Payer: Self-pay | Attending: Family Medicine | Admitting: Family Medicine

## 2017-04-02 ENCOUNTER — Ambulatory Visit: Payer: Self-pay | Admitting: Licensed Clinical Social Worker

## 2017-04-02 VITALS — BP 120/81 | HR 73 | Temp 97.6°F | Ht 62.0 in | Wt 178.4 lb

## 2017-04-02 DIAGNOSIS — Z79899 Other long term (current) drug therapy: Secondary | ICD-10-CM | POA: Insufficient documentation

## 2017-04-02 DIAGNOSIS — F32A Depression, unspecified: Secondary | ICD-10-CM | POA: Insufficient documentation

## 2017-04-02 DIAGNOSIS — Z636 Dependent relative needing care at home: Secondary | ICD-10-CM

## 2017-04-02 DIAGNOSIS — F329 Major depressive disorder, single episode, unspecified: Secondary | ICD-10-CM | POA: Insufficient documentation

## 2017-04-02 DIAGNOSIS — F321 Major depressive disorder, single episode, moderate: Secondary | ICD-10-CM | POA: Insufficient documentation

## 2017-04-02 DIAGNOSIS — E1149 Type 2 diabetes mellitus with other diabetic neurological complication: Secondary | ICD-10-CM | POA: Insufficient documentation

## 2017-04-02 DIAGNOSIS — R413 Other amnesia: Secondary | ICD-10-CM | POA: Insufficient documentation

## 2017-04-02 DIAGNOSIS — M170 Bilateral primary osteoarthritis of knee: Secondary | ICD-10-CM | POA: Insufficient documentation

## 2017-04-02 DIAGNOSIS — F419 Anxiety disorder, unspecified: Secondary | ICD-10-CM

## 2017-04-02 DIAGNOSIS — Z7984 Long term (current) use of oral hypoglycemic drugs: Secondary | ICD-10-CM | POA: Insufficient documentation

## 2017-04-02 DIAGNOSIS — F439 Reaction to severe stress, unspecified: Secondary | ICD-10-CM | POA: Insufficient documentation

## 2017-04-02 DIAGNOSIS — M797 Fibromyalgia: Secondary | ICD-10-CM | POA: Insufficient documentation

## 2017-04-02 DIAGNOSIS — Z9889 Other specified postprocedural states: Secondary | ICD-10-CM | POA: Insufficient documentation

## 2017-04-02 LAB — POCT GLYCOSYLATED HEMOGLOBIN (HGB A1C): HEMOGLOBIN A1C: 6

## 2017-04-02 LAB — GLUCOSE, POCT (MANUAL RESULT ENTRY): POC Glucose: 192 mg/dl — AB (ref 70–99)

## 2017-04-02 MED ORDER — DULOXETINE HCL 60 MG PO CPEP: 60.0000 mg | ORAL_CAPSULE | Freq: Every day | ORAL | 3 refills | Status: DC

## 2017-04-02 MED ORDER — CYCLOBENZAPRINE HCL 10 MG PO TABS: 10.0000 mg | ORAL_TABLET | Freq: Two times a day (BID) | ORAL | 3 refills | Status: DC | PRN

## 2017-04-02 MED ORDER — PRAVASTATIN SODIUM 40 MG PO TABS: 40.0000 mg | ORAL_TABLET | Freq: Every evening | ORAL | 3 refills | Status: AC

## 2017-04-02 MED ORDER — METFORMIN HCL 500 MG PO TABS: ORAL_TABLET | ORAL | 3 refills | Status: DC

## 2017-04-02 MED ORDER — DICLOFENAC SODIUM 75 MG PO TBEC: 75.0000 mg | DELAYED_RELEASE_TABLET | Freq: Two times a day (BID) | ORAL | 3 refills | Status: DC

## 2017-04-02 MED ORDER — PREGABALIN 150 MG PO CAPS: 150.0000 mg | ORAL_CAPSULE | Freq: Two times a day (BID) | ORAL | 3 refills | Status: DC

## 2017-04-02 MED FILL — ?CYCLOBENZAPRINE 10 MG TABL: 10 | 30 days supply | Qty: 60 | Fill #0

## 2017-04-02 MED FILL — ?DULOXETINE HCL DR 60 MG CA: 60 MG | 30 days supply | Qty: 30 | Fill #0

## 2017-04-02 MED FILL — ?DICLOFENAC SOD DR 75 MG TA: 75 | 30 days supply | Qty: 60 | Fill #0

## 2017-04-02 NOTE — BH Specialist Note (Signed)
Integrated Behavioral Health Initial Visit  MRN: [id number] Name: Kathy Perez   Session Start time: 10:25 AM Session End time: 10:40 AM Total time: 15 minutes  Type of Service: Integrated Behavioral Health- Individual/Family Interpretor:No. Interpretor Name and Language: N/A   Warm Hand Off Completed.       SUBJECTIVE: Kathy Perez is a 51 y.o. female accompanied by patient and adult daughter. Patient was referred by Dr. Venetia NightAmao for anxiety and depression. Patient reports the following symptoms/concerns: overwhelming feelings of sadness and worry, difficulty sleeping, decreased appetite, low energy, difficulty concentrating, irritability, and chronic pain Duration of problem: Ongoing; Severity of problem: severe  OBJECTIVE: Mood: Anxious and Dysphoric and Affect: Depressed Risk of harm to self or others: No plan to harm self or others   LIFE CONTEXT: Family and Social: Pt resides with spouse and two of her adult daughters (one who has down syndrome) School/Work: Pt is unemployed. She receives disability check for adult daughter and food stamps Self-Care: Pt utilizes showers and heating pads to cope with chronic pain. 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 She has limited support.  GOALS ADDRESSED: Patient will reduce symptoms of: anxiety and depression and increase knowledge and/or ability of: coping skills and also: Increase adequate support systems for patient/family   INTERVENTIONS: Solution-Focused Strategies, Supportive Counseling, Psychoeducation and/or Health Education and Link to [facility]  Standardized Assessments completed: GAD-7 and PHQ 2&9 with C-SSRS  ASSESSMENT: Patient currently experiencing depression and anxiety triggered by ongoing medical concerns and caregiver stress. She reports overwhelming feelings of sadness  and worry, difficulty sleeping, decreased appetite, low energy, difficulty concentrating, irritability, and chronic pain. Patient has participated in psychotherapy for approx two months and has a scheduled appointment for medication management on September 6, 18 [facility]. LCSWA discussed healthy strategies that pt can utilize on a routine basis to decrease symptoms. Pt was provided resources to assist with independence building skills for daughter with down syndrome, caregiver support groups, and food insecurity.   PLAN: 1. Follow up with behavioral health clinician on : Pt was encouraged to contact LCSWA if symptoms worsen or fail to improve to [facility]. 2. Behavioral recommendations: LCSWA recommends that pt apply healthy coping skills discussed, comply with medication management, and utilize provided resources. Pt is encouraged to schedule follow up appointment with LCSWA 3. Referral(s): [facility] Services (LME/Outside Clinic) and Community Resources:  Food 4. "From scale of 1-10, how likely are you to follow plan?": 8/10  Bridgett LarssonJasmine D Adanna Zuckerman, LCSW 04/02/17 4:48 PM

## 2017-04-02 NOTE — Progress Notes (Signed)
Subjective:  Patient ID: Kathy Perez, female    DOB: Sep 08, 1965  Age: 51 y.o. 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 Last seen by cardiology in 04/2015 (status post cardiac cath in 01/2015 - normal coronaries as per cardiology note) and follow-up on an as needed basis was recommended.  She is accompanied by her daughter today who complains her mom has been depressed lately, stays in her room most of the time has been noticed to cry on a few occasions. The patient endorses being depressed and has an upcoming appointment at [facility] next week. 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 She denies suicidal ideation or intents. The patient has also been known to be forgetful-she has forgotten the car keys in the car, forgotten her purse on the front porch and has forgotten directions to destinations in the recent past. Symptoms have been on for the last 2 months. She has had no recent strokes, weakness in extremities.  Her arthritis has not been controlled as she has not of Lyrica and complains of knee, back and hand pains and is wondering if she can be referred to an 'arthritis specialist'.  Past Medical History:  Diagnosis Date  . Diabetes mellitus without complication (HCC)     No past surgical history on file.  No Known Allergies   Outpatient Medications Prior to Visit  Medication Sig Dispense Refill  . cetirizine (ZYRTEC) 10 MG tablet TAKE 1 TABLET BY MOUTH DAILY. 30 tablet 1  . fluticasone (FLONASE) 50 MCG/ACT nasal spray Place 2 sprays into both nostrils daily. 16 g 6  . Omega-3 Krill Oil 500 MG CAPS Take by mouth.  Reported on 10/09/2015    . diclofenac (VOLTAREN) 75 MG EC tablet Take 1 tablet (75 mg total) by mouth 2 (two) times daily. 60 tablet 0  . metFORMIN (GLUCOPHAGE) 500 MG tablet TAKE 1 TABLET BY MOUTH 2 TIMES DAILY WITH A MEAL. 60 tablet 0  . pravastatin (PRAVACHOL) 40 MG tablet Take 1 tablet (40 mg total) by mouth every evening. 30 tablet 3  . pregabalin (LYRICA) 150 MG capsule Take 1 capsule (150 mg total) by mouth 2 (two) times daily. 180 capsule 3  . amoxicillin (AMOXIL) 500 MG capsule Take 1 capsule (500 mg total) by mouth 3 (three) times daily. (Patient not taking: Reported on 04/02/2017) 30 capsule 0  . nitroGLYCERIN (NITROSTAT) 0.4 MG SL tablet Place 0.4 mg under the tongue. Reported on 09/24/2015    . olopatadine (PATANOL) 0.1 % ophthalmic solution Place 1 drop into both eyes 2 (two) times daily. (Patient not taking: Reported on 05/13/2016) 5 mL 1  . terbinafine (LAMISIL AT) 1 % cream Apply 1 application topically 2 (two) times daily. (Patient not taking: Reported on 04/02/2017) 30 g 1  . UNABLE TO FIND Take by mouth.    . gabapentin (NEURONTIN) 300 MG capsule Take 1 capsule (300 mg total) by mouth at bedtime. (Patient not taking: Reported on 04/02/2017) 90 capsule 3   No facility-administered medications prior to visit.     ROS Review of Systems  Objective:  BP 120/81   Pulse 73   Temp 97.6 F (36.4 C) (  Oral)   Ht 5\' 2"  (1.575 m)   Wt 178 lb 6.4 oz (80.9 kg)   LMP 03/17/2017   SpO2 98%   BMI 32.63 kg/m   BP/Weight 04/02/2017 12/05/2016 05/13/2016  Systolic BP 120 111 125  Diastolic BP 81 82 84  Wt. (Lbs) 178.4 176.4 174  BMI 32.63 32.26 31.83      Physical Exam  Constitutional: She is oriented to person, place, and time. She appears well-developed and well-nourished. No distress.  HENT:  Head: Normocephalic.  Right Ear: External ear normal.  Left Ear: External ear normal.  Nose: Nose normal.  Mouth/Throat: Oropharynx is clear and moist.  Eyes: Pupils are equal, round,  and reactive to light. Conjunctivae and EOM are normal.  Neck: Normal range of motion. No JVD present.  Cardiovascular: Normal rate, regular rhythm, normal heart sounds and intact distal pulses.  Exam reveals no gallop.   No murmur heard. Pulmonary/Chest: Effort normal and breath sounds normal. No respiratory distress. She has no wheezes. She has no rales. She exhibits no tenderness.  Abdominal: Soft. Bowel sounds are normal. She exhibits no distension and no mass. There is no tenderness.  Musculoskeletal: Normal range of motion. She exhibits no edema or tenderness.  Neurological: She is alert and oriented to person, place, and time. She has normal reflexes.  Skin: Skin is warm and dry. She is not diaphoretic.  Psychiatric:  Dysphoric mood    Lab Results  Component Value Date   HGBA1C 6.0 04/02/2017     Assessment & Plan:   1. Type 2 diabetes mellitus with other neurologic complication, without long-term current use of insulin (HCC) Controlled with A1c of 6.0 - POCT glucose (manual entry) - POCT glycosylated hemoglobin (Hb A1C) - metFORMIN (GLUCOPHAGE) 500 MG tablet; TAKE 1 TABLET BY MOUTH 2 TIMES DAILY WITH A MEAL.  Dispense: 60 tablet; Refill: 3  2. Fibromyalgia Uncontrolled due to of Lyrica - pregabalin (LYRICA) 150 MG capsule; Take 1 capsule (150 mg total) by mouth 2 (two) times daily.  Dispense: 180 capsule; Refill: 3 - diclofenac (VOLTAREN) 75 MG EC tablet; Take 1 tablet (75 mg total) by mouth 2 (two) times daily.  Dispense: 60 tablet; Refill: 3 - cyclobenzaprine (FLEXERIL) 10 MG tablet; Take 1 tablet (10 mg total) by mouth 2 (two) times daily as needed for muscle spasms.  Dispense: 60 tablet; Refill: 3 - DULoxetine (CYMBALTA) 60 MG capsule; Take 1 capsule (60 mg total) by mouth daily.  Dispense: 30 capsule; Refill: 3 - TSH  3. Caregiver stress Seen in conjunction with LCSW   4. Current moderate episode of major depressive disorder without prior episode (HCC)\ Depressive  symptoms could be secondary to underlying caregiver stress On further questioning she denies suicidal ideation or intentions stating she mistakenly checked it on the questionnaire Commenced on Cymbalta, which will also help with depressive symptoms - TSH  5. Osteoarthritis of both knees, unspecified osteoarthritis type Continue NSAIDs  6. Memory changes Likely due to underlying depression and caregiver stress Mini cog normal   Meds ordered this encounter  Medications  . metFORMIN (GLUCOPHAGE) 500 MG tablet    Sig: TAKE 1 TABLET BY MOUTH 2 TIMES DAILY WITH A MEAL.    Dispense:  60 tablet    Refill:  3    Must have office visit for refills  . pregabalin (LYRICA) 150 MG capsule    Sig: Take 1 capsule (150 mg total) by mouth 2 (two) times daily.    Dispense:  180 capsule  Refill:  3  . pravastatin (PRAVACHOL) 40 MG tablet    Sig: Take 1 tablet (40 mg total) by mouth every evening.    Dispense:  30 tablet    Refill:  3  . diclofenac (VOLTAREN) 75 MG EC tablet    Sig: Take 1 tablet (75 mg total) by mouth 2 (two) times daily.    Dispense:  60 tablet    Refill:  3  . cyclobenzaprine (FLEXERIL) 10 MG tablet    Sig: Take 1 tablet (10 mg total) by mouth 2 (two) times daily as needed for muscle spasms.    Dispense:  60 tablet    Refill:  3  . DULoxetine (CYMBALTA) 60 MG capsule    Sig: Take 1 capsule (60 mg total) by mouth daily.    Dispense:  30 capsule    Refill:  3    Follow-up: Return in about 3 months (around 07/03/2017) for follow up on depression and memory changes.   Jaclyn Shaggy MD

## 2017-04-03 ENCOUNTER — Telehealth: Payer: Self-pay

## 2017-04-03 ENCOUNTER — Encounter: Payer: Self-pay | Admitting: Family Medicine

## 2017-04-03 LAB — TSH: TSH: 1.81 u[IU]/mL (ref 0.450–4.500)

## 2017-04-03 NOTE — Telephone Encounter (Signed)
Pt was called and informed of lab results. 

## 2017-04-27 ENCOUNTER — Other Ambulatory Visit: Payer: Self-pay

## 2017-04-27 DIAGNOSIS — M797 Fibromyalgia: Secondary | ICD-10-CM

## 2017-04-27 MED ORDER — PREGABALIN 150 MG PO CAPS: 150.0000 mg | ORAL_CAPSULE | Freq: Two times a day (BID) | ORAL | 3 refills | Status: DC

## 2017-04-29 MED FILL — PRAVASTATIN NA 40 MG TAB: 40 | 30 days supply | Qty: 30 | Fill #3

## 2017-05-06 ENCOUNTER — Ambulatory Visit: Payer: Self-pay

## 2017-05-25 MED FILL — ?METFORMIN HCL 500MG TABLET: 500 | 30 days supply | Qty: 60 | Fill #0

## 2017-05-25 MED FILL — ?PRAVASTATIN SODIUM 40MG TA: 40 | 30 days supply | Qty: 30 | Fill #0

## 2017-05-25 MED FILL — ?CETIRIZINE HCL 10 MG TABLE: 10 | 30 days supply | Qty: 30 | Fill #1

## 2017-05-25 MED FILL — ?DICLOFENAC SOD DR 75 MG TA: 75 | 30 days supply | Qty: 60 | Fill #1

## 2017-06-01 ENCOUNTER — Encounter: Payer: Self-pay | Admitting: Physician Assistant

## 2017-06-01 ENCOUNTER — Ambulatory Visit: Payer: Self-pay | Attending: Family Medicine | Admitting: Physician Assistant

## 2017-06-01 VITALS — BP 107/73 | HR 88 | Temp 98.3°F | Resp 18 | Ht 62.0 in | Wt 175.0 lb

## 2017-06-01 DIAGNOSIS — Z79899 Other long term (current) drug therapy: Secondary | ICD-10-CM | POA: Insufficient documentation

## 2017-06-01 DIAGNOSIS — H938X3 Other specified disorders of ear, bilateral: Secondary | ICD-10-CM

## 2017-06-01 DIAGNOSIS — Z8709 Personal history of other diseases of the respiratory system: Secondary | ICD-10-CM

## 2017-06-01 DIAGNOSIS — B379 Candidiasis, unspecified: Secondary | ICD-10-CM | POA: Insufficient documentation

## 2017-06-01 DIAGNOSIS — E1149 Type 2 diabetes mellitus with other diabetic neurological complication: Secondary | ICD-10-CM | POA: Insufficient documentation

## 2017-06-01 DIAGNOSIS — H838X3 Other specified diseases of inner ear, bilateral: Secondary | ICD-10-CM | POA: Insufficient documentation

## 2017-06-01 DIAGNOSIS — Z7984 Long term (current) use of oral hypoglycemic drugs: Secondary | ICD-10-CM | POA: Insufficient documentation

## 2017-06-01 DIAGNOSIS — J40 Bronchitis, not specified as acute or chronic: Secondary | ICD-10-CM | POA: Insufficient documentation

## 2017-06-01 HISTORY — DX: Personal history of other diseases of the respiratory system: Z87.09

## 2017-06-01 LAB — GLUCOSE, POCT (MANUAL RESULT ENTRY): POC Glucose: 134 mg/dl — AB (ref 70–99)

## 2017-06-01 MED ORDER — AZITHROMYCIN 250 MG PO TABS: ORAL_TABLET | ORAL | 0 refills | Status: DC

## 2017-06-01 MED ORDER — FLUTICASONE PROPIONATE 50 MCG/ACT NA SUSP: 2.0000 | Freq: Every day | NASAL | 6 refills | Status: AC

## 2017-06-01 MED ORDER — FLUCONAZOLE 150 MG PO TABS: ORAL_TABLET | ORAL | 0 refills | Status: DC

## 2017-06-01 MED FILL — AZITHROMYCIN 250 MG TABLET: 250 | 5 days supply | Qty: 6 | Fill #0

## 2017-06-01 MED FILL — FLUTICASONE PROP 50 MCG SPR: 50 | 30 days supply | Qty: 16 | Fill #0

## 2017-06-01 MED FILL — FLUCONAZOLE 150 MG TABLET: 150 | 10 days supply | Qty: 3 | Fill #0

## 2017-06-01 NOTE — Progress Notes (Signed)
Kathy Perez, is a 51 y.o. female  IRC:[id number]  [id number]:[id number]  DOB - [birth year]  Subjective:  Chief Complaint and HPI: Kathy Perez is a 51 y.o. female here today 2 week h/o cough and congestion.  +coughing up greenish yellow mucus.  No f/c.  +sinus congestion and sound distortion.    Also vaginal itching and irritation since taking an antibiotic a few weeks ago.  She doesn't remember the name of the med.  No STI risk factors  +some anxiety and depression with her son being in the hospital.  I offered her to meet with Kathy Perez but she said she doesn't have time today and that she met with Kathy Perez previously.  She has a Social worker she sees.   She is also requesting a handicap parking placard.   ROS:   Constitutional:  No f/c, No night sweats, No unexplained weight loss. EENT:  No vision changes, No blurry vision, No hearing changes. No mouth, throat, or ear problems other than stated above Respiratory: + cough, No SOB Cardiac: No CP, no palpitations GI:  No abd pain, No N/V/D. GU: No Urinary s/sx Musculoskeletal: No joint pain Neuro: No headache, no dizziness, no motor weakness.  Skin: No rash Endocrine:  No polydipsia. No polyuria.  Psych: Denies SI/HI  No problems updated.  ALLERGIES: No Known Allergies  PAST MEDICAL HISTORY: Past Medical History:  Diagnosis Date  . Diabetes mellitus without complication ([place])     MEDICATIONS AT HOME: Prior to Admission medications   Medication Sig Start Date End Date Taking? Authorizing Provider  cetirizine (ZYRTEC) 10 MG tablet TAKE 1 TABLET BY MOUTH DAILY. 02/13/17  Yes Arnoldo Morale, MD  cyclobenzaprine (FLEXERIL) 10 MG tablet Take 1 tablet (10 mg total) by mouth 2 (two) times daily as needed for muscle spasms. 04/02/17  Yes Arnoldo Morale, MD  diclofenac (VOLTAREN) 75 MG EC tablet Take 1 tablet (75 mg total) by mouth 2 (two) times daily. 04/02/17  Yes Arnoldo Morale, MD  DULoxetine (CYMBALTA) 60 MG capsule Take 1 capsule (60  mg total) by mouth daily. 04/02/17  Yes Arnoldo Morale, MD  fluticasone (FLONASE) 50 MCG/ACT nasal spray Place 2 sprays into both nostrils daily. 06/01/17  Yes Perez, Kathy M, PA-C  metFORMIN (GLUCOPHAGE) 500 MG tablet TAKE 1 TABLET BY MOUTH 2 TIMES DAILY WITH A MEAL. 04/02/17  Yes Arnoldo Morale, MD  Omega-3 Krill Oil 500 MG CAPS Take by mouth. Reported on 10/09/2015   Yes [provider]  pravastatin (PRAVACHOL) 40 MG tablet Take 1 tablet (40 mg total) by mouth every evening. 04/02/17  Yes Arnoldo Morale, MD  pregabalin (LYRICA) 150 MG capsule Take 1 capsule (150 mg total) by mouth 2 (two) times daily. 04/27/17  Yes Arnoldo Morale, MD  UNABLE TO FIND Take by mouth.   Yes [provider]  azithromycin (ZITHROMAX) 250 MG tablet Take 2 today then 1 daily 06/01/17   Kathy Donovan, PA-C  fluconazole (DIFLUCAN) 150 MG tablet Take 1 today, 1 on 11/3 and 1 on 11/8 for vaginal yeast infection 06/01/17   Kathy Donovan, PA-C     Objective:  EXAM:   Vitals:   06/01/17 1041  BP: 107/73  Pulse: 88  Resp: 18  Temp: 98.3 F (36.8 C)  TempSrc: Oral  SpO2: 98%  Weight: 175 lb (79.4 kg)  Height: '5\' 2"'$  (1.575 m)    General appearance : A&OX3. NAD. Non-toxic-appearing HEENT: Atraumatic and Normocephalic.  PERRLA. EOM intact.  TM full B w/o erythema/infection.  Mouth-MMM, post pharynx WNL w/o erythema, No PND. +turbinates with congestion and clear rhinorrhea Neck: supple, no JVD. No cervical lymphadenopathy. No thyromegaly Chest/Lungs:  Breathing-non-labored, Good air entry bilaterally, breath sounds normal without rales, rhonchi, or wheezing  CVS: S1 S2 regular, no murmurs, gallops, rubs  Extremities: Bilateral Lower Ext shows no edema, both legs are warm to touch with = pulse throughout Neurology:  CN II-XII grossly intact, Non focal.   Psych:  TP linear. J/I WNL. Normal speech. Appropriate eye contact and affect.  Skin:  No Rash  Data Review Lab Results  Component Value Date    HGBA1C 6.0 04/02/2017   HGBA1C 6.2 12/05/2016   HGBA1C 6.3 05/13/2016     Assessment & Plan   1. Type 2 diabetes mellitus with other neurologic complication, without long-term current use of insulin (HCC) Adequate control.  Continue current regimen - Glucose (CBG)  2. Bronchitis Cover for atypicals - azithromycin (ZITHROMAX) 250 MG tablet; Take 2 today then 1 daily  Dispense: 6 tablet; Refill: 0  3. Yeast infection Recent antibiotic - fluconazole (DIFLUCAN) 150 MG tablet; Take 1 today, 1 on 11/3 and 1 on 11/8 for vaginal yeast infection  Dispense: 3 tablet; Refill: 0  4. Congestion of both ears - fluticasone (FLONASE) 50 MCG/ACT nasal spray; Place 2 sprays into both nostrils daily.  Dispense: 16 g; Refill: 6   Patient have been counseled extensively about nutrition and exercise  Return in about 3 months (around 09/01/2017) for Kathy Perez for DM f/up.  The patient was given clear instructions to go to ER or return to medical center if symptoms don't improve, worsen or new problems develop. The patient verbalized understanding. The patient was told to call to get lab results if they haven't heard anything in the next week.     Kathy Caldron, PA-C [facility] and [place], [place]   06/01/2017, 10:49 AMPatient ID: Kathy Perez, female   DOB: 11-25-1965, 51 y.o.   MRN: [id number]

## 2017-07-15 MED FILL — ?PRAVASTATIN SODIUM 40MG TA: 40 | 30 days supply | Qty: 30 | Fill #1

## 2017-07-15 MED FILL — ?METFORMIN HCL 500MG TABLET: 500 | 30 days supply | Qty: 60 | Fill #2

## 2017-08-20 MED FILL — ?METFORMIN HCL 500MG TABLET: 500 | 30 days supply | Qty: 60 | Fill #1

## 2017-08-20 MED FILL — ?PRAVASTATIN SODIUM 40MG TA: 40 | 30 days supply | Qty: 30 | Fill #1

## 2017-08-20 MED FILL — CYCLOBENZAPRINE 10 MG TAB: 10 | 30 days supply | Qty: 60 | Fill #1

## 2017-09-15 MED FILL — ?METFORMIN HCL 500MG TABLET: 500 | 30 days supply | Qty: 60 | Fill #2

## 2017-09-15 MED FILL — ?DICLOFENAC SOD DR 75 MG TA: 75 | 30 days supply | Qty: 60 | Fill #2

## 2017-09-15 MED FILL — ?PRAVASTATIN SODIUM 40MG TA: 40 | 30 days supply | Qty: 30 | Fill #2

## 2017-09-18 ENCOUNTER — Ambulatory Visit: Payer: Medicaid Other | Attending: Family Medicine

## 2017-10-01 ENCOUNTER — Ambulatory Visit: Payer: Medicaid Other | Attending: Family Medicine | Admitting: Physician Assistant

## 2017-10-01 VITALS — BP 105/73 | HR 85 | Temp 98.7°F | Resp 16 | Ht 62.0 in | Wt 178.2 lb

## 2017-10-01 DIAGNOSIS — E1149 Type 2 diabetes mellitus with other diabetic neurological complication: Secondary | ICD-10-CM | POA: Insufficient documentation

## 2017-10-01 DIAGNOSIS — Z79899 Other long term (current) drug therapy: Secondary | ICD-10-CM | POA: Diagnosis not present

## 2017-10-01 DIAGNOSIS — H6690 Otitis media, unspecified, unspecified ear: Secondary | ICD-10-CM | POA: Diagnosis not present

## 2017-10-01 DIAGNOSIS — H9201 Otalgia, right ear: Secondary | ICD-10-CM | POA: Diagnosis present

## 2017-10-01 DIAGNOSIS — Z7984 Long term (current) use of oral hypoglycemic drugs: Secondary | ICD-10-CM | POA: Diagnosis not present

## 2017-10-01 HISTORY — DX: Otitis media, unspecified, unspecified ear: H66.90

## 2017-10-01 LAB — POCT GLYCOSYLATED HEMOGLOBIN (HGB A1C): HEMOGLOBIN A1C: 5.9

## 2017-10-01 LAB — GLUCOSE, POCT (MANUAL RESULT ENTRY): POC GLUCOSE: 160 mg/dL — AB (ref 70–99)

## 2017-10-01 MED ORDER — METFORMIN HCL 500 MG PO TABS: ORAL_TABLET | ORAL | 5 refills | Status: AC

## 2017-10-01 MED ORDER — AMOXICILLIN 500 MG PO CAPS: 500.0000 mg | ORAL_CAPSULE | Freq: Three times a day (TID) | ORAL | 0 refills | Status: DC

## 2017-10-01 MED FILL — AMOXICILLIN 500 MG CAPSULE: 500 | 10 days supply | Qty: 30 | Fill #0

## 2017-10-01 NOTE — Progress Notes (Signed)
Patient is here for ear pain that comes with headaches and sore throat.  Started 2 days ago.   Cough a week ago with mucus.

## 2017-10-01 NOTE — Progress Notes (Signed)
Patient ID: Kathy Perez, female   DOB: [birth year], 52 y.o.   MRN: [id number]     Kathy Perez, is a 52 y.o. female  [id number]:[id number]  [id number]  DOB - 02-24-1966  Subjective:  Chief Complaint and HPI: Kathy Perez is a 52 y.o. female here today for Pain in R ear X 3 days and causing HA.  Some coughing about 2 weeks ago.  That has improved.  No f/c.    Taking DM meds.  No hyper/hypoglycemia.  ROS:   Constitutional:  No f/c, No night sweats, No unexplained weight loss. EENT:  No vision changes, No blurry vision, no sneezing, runny nose, etc.  +R ear pain  Respiratory: No cough, No SOB Cardiac: No CP, no palpitations GI:  No abd pain, No N/V/D. GU: No Urinary s/sx Musculoskeletal: No joint pain Neuro: No headache, no dizziness, no motor weakness.  Skin: No rash Endocrine:  No polydipsia. No polyuria.  Psych: Denies SI/HI  No problems updated.  ALLERGIES: No Known Allergies  PAST MEDICAL HISTORY: Past Medical History:  Diagnosis Date  . Diabetes mellitus without complication (HCC)     MEDICATIONS AT HOME: Prior to Admission medications   Medication Sig Start Date End Date Taking? Authorizing Provider  cetirizine (ZYRTEC) 10 MG tablet TAKE 1 TABLET BY MOUTH DAILY. 02/13/17  Yes Hoy Register, MD  cyclobenzaprine (FLEXERIL) 10 MG tablet Take 1 tablet (10 mg total) by mouth 2 (two) times daily as needed for muscle spasms. 04/02/17  Yes Hoy Register, MD  diclofenac (VOLTAREN) 75 MG EC tablet Take 1 tablet (75 mg total) by mouth 2 (two) times daily. 04/02/17  Yes Hoy Register, MD  DULoxetine (CYMBALTA) 60 MG capsule Take 1 capsule (60 mg total) by mouth daily. 04/02/17  Yes Newlin, Odette Horns, MD  fluticasone (FLONASE) 50 MCG/ACT nasal spray Place 2 sprays into both nostrils daily. 06/01/17  Yes McClung, Angela M, PA-C  metFORMIN (GLUCOPHAGE) 500 MG tablet TAKE 1 TABLET BY MOUTH 2 TIMES DAILY WITH A MEAL. 10/01/17  Yes McClung, Marzella Schlein, PA-C  Omega-3 Krill Oil 500 MG CAPS  Take by mouth. Reported on 10/09/2015   Yes [provider]  pravastatin (PRAVACHOL) 40 MG tablet Take 1 tablet (40 mg total) by mouth every evening. 04/02/17  Yes Hoy Register, MD  pregabalin (LYRICA) 150 MG capsule Take 1 capsule (150 mg total) by mouth 2 (two) times daily. 04/27/17  Yes Hoy Register, MD  amoxicillin (AMOXIL) 500 MG capsule Take 1 capsule (500 mg total) by mouth 3 (three) times daily. 10/01/17   Anders Simmonds, PA-C  UNABLE TO FIND Take by mouth.    [provider]     Objective:  EXAM:   Vitals:   10/01/17 1500  BP: 105/73  Pulse: 85  Resp: 16  Temp: 98.7 F (37.1 C)  TempSrc: Oral  SpO2: 97%  Weight: 178 lb 3.2 oz (80.8 kg)  Height: 5\' 2"  (1.575 m)    General appearance : A&OX3. NAD. Non-toxic-appearing HEENT: Atraumatic and Normocephalic.  PERRLA. EOM intact.  LTM WNL,  R TM bulging and erythematous with distorted landmarks Mouth-MMM, post pharynx WNL w/o erythema, + PND. Neck: supple, no JVD. No cervical lymphadenopathy. No thyromegaly Chest/Lungs:  Breathing-non-labored, Good air entry bilaterally, breath sounds normal without rales, rhonchi, or wheezing  CVS: S1 S2 regular, no murmurs, gallops, rubs  Extremities: Bilateral Lower Ext shows no edema, both legs are warm to touch with = pulse throughout Neurology:  CN II-XII grossly intact, Non focal.  Psych:  TP linear. J/I WNL. Normal speech. Appropriate eye contact and affect.  Skin:  No Rash  Data Review Lab Results  Component Value Date   HGBA1C 5.9 10/01/2017   HGBA1C 6.0 04/02/2017   HGBA1C 6.2 12/05/2016     Assessment & Plan   1. Acute otitis media, unspecified otitis media type Use flonase(she already has this) - amoxicillin (AMOXIL) 500 MG capsule; Take 1 capsule (500 mg total) by mouth 3 (three) times daily.  Dispense: 30 capsule; Refill: 0  2. Type 2 diabetes mellitus with other neurologic complication, without long-term current use of insulin  (HCC) Controlled-continue current regiman - Glucose (CBG) - HgB A1c - metFORMIN (GLUCOPHAGE) 500 MG tablet; TAKE 1 TABLET BY MOUTH 2 TIMES DAILY WITH A MEAL.  Dispense: 60 tablet; Refill: 5   Patient have been counseled extensively about nutrition and exercise  Return in about 6 months (around 03/31/2018) for Dr Alvis LemmingsNewlin: DM.  The patient was given clear instructions to go to ER or return to medical center if symptoms don't improve, worsen or new problems develop. The patient verbalized understanding. The patient was told to call to get lab results if they haven't heard anything in the next week.     Georgian CoAngela McClung, PA-C [facility] [place], [place] [phone]   10/01/2017, 3:19 PM

## 2017-10-14 ENCOUNTER — Other Ambulatory Visit: Payer: Self-pay

## 2017-10-14 DIAGNOSIS — M797 Fibromyalgia: Secondary | ICD-10-CM

## 2017-10-14 MED ORDER — PREGABALIN 150 MG PO CAPS: 150.0000 mg | ORAL_CAPSULE | Freq: Two times a day (BID) | ORAL | 1 refills | Status: DC

## 2017-10-16 ENCOUNTER — Other Ambulatory Visit: Payer: Self-pay | Admitting: Family Medicine

## 2017-10-16 DIAGNOSIS — Z1231 Encounter for screening mammogram for malignant neoplasm of breast: Secondary | ICD-10-CM

## 2017-10-20 MED FILL — ?METFORMIN HCL 500MG TABLET: 500 | 30 days supply | Qty: 60 | Fill #3

## 2017-10-20 MED FILL — ?PRAVASTATIN SODIUM 40MG TA: 40 | 30 days supply | Qty: 30 | Fill #3

## 2017-10-29 ENCOUNTER — Encounter: Payer: Self-pay | Admitting: Family Medicine

## 2017-10-29 ENCOUNTER — Ambulatory Visit: Payer: Medicaid Other | Attending: Family Medicine | Admitting: Family Medicine

## 2017-10-29 VITALS — BP 121/83 | HR 76 | Temp 98.6°F | Resp 16 | Wt 175.4 lb

## 2017-10-29 DIAGNOSIS — E118 Type 2 diabetes mellitus with unspecified complications: Secondary | ICD-10-CM

## 2017-10-29 DIAGNOSIS — R05 Cough: Secondary | ICD-10-CM | POA: Diagnosis present

## 2017-10-29 DIAGNOSIS — J029 Acute pharyngitis, unspecified: Secondary | ICD-10-CM | POA: Diagnosis present

## 2017-10-29 DIAGNOSIS — R5383 Other fatigue: Secondary | ICD-10-CM | POA: Diagnosis present

## 2017-10-29 DIAGNOSIS — J069 Acute upper respiratory infection, unspecified: Secondary | ICD-10-CM | POA: Diagnosis not present

## 2017-10-29 DIAGNOSIS — Z7984 Long term (current) use of oral hypoglycemic drugs: Secondary | ICD-10-CM | POA: Insufficient documentation

## 2017-10-29 DIAGNOSIS — Z79899 Other long term (current) drug therapy: Secondary | ICD-10-CM | POA: Insufficient documentation

## 2017-10-29 LAB — GLUCOSE, POCT (MANUAL RESULT ENTRY): POC GLUCOSE: 190 mg/dL — AB (ref 70–99)

## 2017-10-29 MED ORDER — AZITHROMYCIN 250 MG PO TABS: ORAL_TABLET | ORAL | 0 refills | Status: DC

## 2017-10-29 MED ORDER — FLUCONAZOLE 150 MG PO TABS: ORAL_TABLET | ORAL | 0 refills | Status: DC

## 2017-10-29 MED ORDER — FLUCONAZOLE 150 MG PO TABS: 150.0000 mg | ORAL_TABLET | Freq: Once | ORAL | 0 refills | Status: DC

## 2017-10-29 MED FILL — FLUCONAZOLE 150 MG TABLET: 150 | 3 days supply | Qty: 2 | Fill #0

## 2017-10-29 MED FILL — AZITHROMYCIN 250 MG TABLET: 250 | 5 days supply | Qty: 6 | Fill #0

## 2017-10-29 NOTE — Progress Notes (Signed)
Patient ID: Kathy Perez, female    DOB: [birth year], 52 y.o.   MRN: [id number]  PCP: Hoy RegisterNewlin, Enobong, MD  Chief Complaint  Patient presents with  . URI    Subjective:  HPI  Kathy Perez is a 52 y.o. female presents for complaints of sore throat, fatigue and cough. She endorses symptoms started 5 days ago and she has been managing with tylenol and over the counter cough and cold medicine with no improvement. She endorses that her husband was in the hospital with flu and bronchitis this week as well as her son had the flu at home. She was last evaluated with ear pain end of February and completed a course of antibiotics then. She endorses having a fever and cough with green and yellow mucous production. She endorses intermittent headaches. She denies any chest pain, palpitations, or shortness of breath    Social History   Socioeconomic History  . Marital status: Married    Spouse name: Not on file  . Number of children: Not on file  . Years of education: Not on file  . Highest education level: Not on file  Occupational History  . Not on file  Social Needs  . Financial resource strain: Not on file  . Food insecurity:    Worry: Not on file    Inability: Not on file  . Transportation needs:    Medical: Not on file    Non-medical: Not on file  Tobacco Use  . Smoking status: Never Smoker  . Smokeless tobacco: Never Used  Substance and Sexual Activity  . Alcohol use: No  . Drug use: No  . Sexual activity: Not on file  Lifestyle  . Physical activity:    Days per week: Not on file    Minutes per session: Not on file  . Stress: Not on file  Relationships  . Social connections:    Talks on phone: Not on file    Gets together: Not on file    Attends religious service: Not on file    Active member of club or organization: Not on file    Attends meetings of clubs or organizations: Not on file    Relationship status: Not on file  . Intimate partner violence:    Fear of  current or ex partner: Not on file    Emotionally abused: Not on file    Physically abused: Not on file    Forced sexual activity: Not on file  Other Topics Concern  . Not on file  Social History Narrative  . Not on file    No family history on file.   Review of Systems  Constitutional: Positive for fatigue and fever.  HENT: Positive for ear pain, rhinorrhea and sore throat. Negative for ear discharge.   Eyes: Positive for discharge and itching.       Watery drainage  Respiratory: Positive for cough. Negative for chest tightness and shortness of breath.        Green and yellow mucous production  Cardiovascular: Negative.   Gastrointestinal: Positive for nausea.    Patient Active Problem List   Diagnosis Date Noted  . Caregiver stress 04/02/2017  . Depression 04/02/2017  . Finger fracture, left 10/09/2015  . Hyperlipidemia 09/24/2015  . Type 2 diabetes mellitus (HCC) 06/15/2015  . Acid reflux 06/15/2015  . 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 Amnesia 04/05/2014  . Back ache 11/11/2013  . Fibromyalgia 09/12/2013  .  Gastrointestinal ulcer due to Helicobacter pylori 09/12/2013  . Gonalgia 09/12/2013  . Arthritis, degenerative 09/12/2013  . 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 Hand paresthesia 08/26/2013  . Absolute anemia 08/16/2013  . Allergic rhinitis 09/09/2011    No Known Allergies  Prior to Admission medications   Medication Sig Start Date End Date Taking? Authorizing Provider  amoxicillin (AMOXIL) 500 MG capsule Take 1 capsule (500 mg total) by mouth 3 (three) times daily. Patient not taking: Reported on 10/29/2017 10/01/17   Anders Simmonds, PA-C  azithromycin (ZITHROMAX Z-PAK) 250 MG tablet Start Azithromycin Take 2 tabs x 1 dose, then 1 tab every day for x 4 days 10/29/17   Bing Neighbors, FNP  cetirizine (ZYRTEC) 10 MG tablet TAKE 1 TABLET BY MOUTH DAILY. 02/13/17   Hoy Register, MD   cyclobenzaprine (FLEXERIL) 10 MG tablet Take 1 tablet (10 mg total) by mouth 2 (two) times daily as needed for muscle spasms. 04/02/17   Hoy Register, MD  diclofenac (VOLTAREN) 75 MG EC tablet Take 1 tablet (75 mg total) by mouth 2 (two) times daily. 04/02/17   Hoy Register, MD  DULoxetine (CYMBALTA) 60 MG capsule Take 1 capsule (60 mg total) by mouth daily. 04/02/17   Hoy Register, MD  fluconazole (DIFLUCAN) 150 MG tablet Take 1 dose. Repeat in 3 days if needed. 10/29/17   Bing Neighbors, FNP  fluticasone (FLONASE) 50 MCG/ACT nasal spray Place 2 sprays into both nostrils daily. 06/01/17   Anders Simmonds, PA-C  metFORMIN (GLUCOPHAGE) 500 MG tablet TAKE 1 TABLET BY MOUTH 2 TIMES DAILY WITH A MEAL. 10/01/17   Anders Simmonds, PA-C  Omega-3 Krill Oil 500 MG CAPS Take by mouth. Reported on 10/09/2015    [provider]  pravastatin (PRAVACHOL) 40 MG tablet Take 1 tablet (40 mg total) by mouth every evening. 04/02/17   Hoy Register, MD  pregabalin (LYRICA) 150 MG capsule Take 1 capsule (150 mg total) by mouth 2 (two) times daily. 10/14/17   Hoy Register, MD  UNABLE TO FIND Take by mouth.    [provider]    Past Medical, Surgical Family and Social History reviewed and updated.    Objective:   Today's Vitals   10/29/17 1517 10/29/17 1518  BP: 121/83   Pulse: 76   Resp: 16   Temp: 98.6 F (37 C)   TempSrc: Oral   SpO2: 96%   Weight: 175 lb 6.4 oz (79.6 kg)   PainSc:  7     Wt Readings from Last 3 Encounters:  10/29/17 175 lb 6.4 oz (79.6 kg)  10/01/17 178 lb 3.2 oz (80.8 kg)  06/01/17 175 lb (79.4 kg)    Physical Exam  Constitutional: She is oriented to person, place, and time. She appears well-developed and well-nourished.  HENT:  Head: Normocephalic.  Right Ear: External ear normal.  Left Ear: External ear normal.  Mouth/Throat: Oropharynx is clear and moist. No oropharyngeal exudate.  bilateral ear tenderness with right greater than left on  examination, bilateral TMP pearly grey without erythema On palpation tenderness over the frontal and maxillary sinus oropharynx moist and edematous without erythema and no exudate on tonsils  Eyes: Pupils are equal, round, and reactive to light. Conjunctivae are normal. Right eye exhibits no discharge. Left eye exhibits no discharge.  Neck: Normal range of motion. Neck supple.  Cardiovascular: Normal rate, regular rhythm and normal heart sounds.  Pulmonary/Chest: Effort normal and breath sounds normal.  Neurological: She is alert and oriented  to person, place, and time.  Skin: Skin is warm and dry.  Psychiatric: She has a normal mood and affect. Her behavior is normal. Judgment and thought content normal.           Assessment & Plan:  1. Type 2 diabetes mellitus with complication, unspecified whether long term insulin use (HCC)  - POCT glucose (manual entry)  2. Upper respiratory infection, acute - azithromycin 250 mg x 5 days,  -diflucan 150 mg tablet po may repeat in 3 days if needed   If symptoms worsen or do not improve, return for follow-up, follow-up with PCP, or at the emergency department if severity of symptoms warrant a higher level of care. Legrand Pitts, FNP Student

## 2017-10-29 NOTE — Progress Notes (Deleted)
Pt states husband was dx with the flu 2 weeks ago

## 2017-10-30 NOTE — Progress Notes (Signed)
Subjective:  Kathy Perez is a 52 y.o. female who presents for evaluation of influenza like symptoms.  Symptoms include congestion, cough described as scant thick sputum production, fever: sweats and subjective , productive cough with yellow colored sputum and sore throat.  Onset of symptoms was 5 days ago, and has been gradually worsening since that time.  Treatment to date:  acetaminophen.  High risk factors for influenza complications:  none.  The following portions of the patient's history were reviewed and updated as appropriate:  allergies, current medications and past medical history  Unknown family medical history of cardiovascular disease, lung disease, and or diabetes.  Past Medical History:  Diagnosis Date  . Diabetes mellitus without complication (HCC)    Pertinent items are noted in HPI. Objective:  BP 121/83   Pulse 76   Temp 98.6 F (37 C) (Oral)   Resp 16   Wt 175 lb 6.4 oz (79.6 kg)   SpO2 96%   BMI 32.08 kg/m  General appearance: alert, cooperative and ill-appearing. Head: Normocephalic, without obvious abnormality, atraumatic Ears: normal TM's and external ear canals both ears Nose: Nares erythema and edema present. Drainage present. Throat: lips, mucosa, and tongue normal; teeth and gums normal-cervical adenopathy noted. Mild erythema of throat noted. Throat is negative of exudate. Neck: supple, symmetrical, trachea midline and thyroid not enlarged, symmetric, no tenderness/mass/nodules Lungs: clear to auscultation bilaterally Heart: regular rate and rhythm, S1, S2 normal, no murmur, click, rub or gallop  Assessment:  Upper Respiratory Infection   Plan:  Discussed diagnosis and treatment of URI. Zithromax per orders. Diflucan 150 mg ordered for vaginal irritation. May repeat dose if vaginal irritation persists.  Meds ordered this encounter  Medications  . DISCONTD: fluconazole (DIFLUCAN) 150 MG tablet    Sig: Take 1 tablet (150 mg total) by mouth once for  1 dose.    Dispense:  1 tablet    Refill:  0  . azithromycin (ZITHROMAX Z-PAK) 250 MG tablet    Sig: Start Azithromycin Take 2 tabs x 1 dose, then 1 tab every day for x 4 days    Dispense:  6 each    Refill:  0    Order Specific Question:   Supervising Provider    Answer:   Quentin AngstJEGEDE, OLUGBEMIGA E [id number]]  . fluconazole (DIFLUCAN) 150 MG tablet    Sig: Take 1 dose. Repeat in 3 days if needed.    Dispense:  2 tablet    Refill:  0    Order Specific Question:   Supervising Provider    Answer:   Quentin AngstJEGEDE, OLUGBEMIGA E [id number]]     Orders Placed This Encounter  Procedures  . POCT glucose (manual entry)       RTC: 3 months for chronic condition follow-up   Godfrey PickKimberly S. Tiburcio PeaHarris, MSN, Outpatient CarecenterFNP-C [facility]  [address] [place], [place], [place] [address] [phone]

## 2017-11-11 ENCOUNTER — Ambulatory Visit
Admission: RE | Admit: 2017-11-11 | Discharge: 2017-11-11 | Disposition: A | Discharge status: Home / Self Care | Payer: Medicaid Other | Source: Ambulatory Visit | Attending: Family Medicine | Admitting: Family Medicine

## 2017-11-11 DIAGNOSIS — Z1231 Encounter for screening mammogram for malignant neoplasm of breast: Secondary | ICD-10-CM

## 2017-11-19 ENCOUNTER — Telehealth: Payer: Self-pay

## 2017-11-19 NOTE — Telephone Encounter (Signed)
Patient was called and informed of lab results. 

## 2017-12-02 MED FILL — ?METFORMIN HCL 500MG TABLET: 500 | 30 days supply | Qty: 60 | Fill #0

## 2017-12-02 MED FILL — PRAVASTATIN NA 40 MG TAB: 40 | 30 days supply | Qty: 30 | Fill #2

## 2017-12-02 MED FILL — ?DICLOFENAC SOD DR 75 MG TA: 75 | 30 days supply | Qty: 60 | Fill #3

## 2018-02-08 MED FILL — PRAVASTATIN NA 40 MG TAB: 40 | 30 days supply | Qty: 30 | Fill #3

## 2018-02-08 MED FILL — ?METFORMIN HCL 500MG TABS: 500 | 30 days supply | Qty: 60 | Fill #1

## 2018-07-29 ENCOUNTER — Other Ambulatory Visit: Payer: Self-pay

## 2018-07-29 ENCOUNTER — Encounter (HOSPITAL_BASED_OUTPATIENT_CLINIC_OR_DEPARTMENT_OTHER): Payer: Self-pay

## 2018-07-29 NOTE — Progress Notes (Signed)
Spoke with: Osborne CascoNadia NPO:  After Midnight, no gum, candy, or mints   Arrival time: 0530AM Labs: CBC, BMP, HbA1C, UPT, EKG AM medications: Gabapentin, Flonase Pre op orders: No spoke with Dahlia ClientHannah Ride home:  [place] (daughter) [phone] or Davene Costainbula Huesni (daughter) [phone]

## 2018-09-28 ENCOUNTER — Encounter (HOSPITAL_BASED_OUTPATIENT_CLINIC_OR_DEPARTMENT_OTHER): Payer: Self-pay | Admitting: *Deleted

## 2018-09-28 ENCOUNTER — Other Ambulatory Visit: Payer: Self-pay

## 2018-09-28 NOTE — Progress Notes (Signed)
Spoke with patient via telephone for pre op interview. NPO after MN. No medications AM of surgery. Will need CBC, BMET, T&S and UPT AM of surgery. Arrival time 0530.

## 2018-09-30 NOTE — Anesthesia Preprocedure Evaluation (Addendum)
Anesthesia Evaluation  Patient identified by MRN, date of birth, ID band Patient awake    Reviewed: Allergy & Precautions, NPO status , Patient's Chart, lab work & pertinent test results  History of Anesthesia Complications Negative for: history of anesthetic complications  Airway Mallampati: II  TM Distance: >3 FB Neck ROM: Full    Dental  (+) Teeth Intact, Dental Advisory Given   Pulmonary neg pulmonary ROS,    Pulmonary exam normal breath sounds clear to auscultation       Cardiovascular negative cardio ROS Normal cardiovascular exam Rhythm:Regular Rate:Normal     Neuro/Psych  Headaches, Depression    GI/Hepatic Neg liver ROS, PUD, GERD  Controlled and Medicated,  Endo/Other  negative endocrine ROSdiabetes, Type 2  Renal/GU negative Renal ROS     Musculoskeletal  (+) Arthritis , Fibromyalgia -  Abdominal   Peds  Hematology negative hematology ROS (+)   Anesthesia Other Findings Day of surgery medications reviewed with the patient.  Reproductive/Obstetrics                            Anesthesia Physical Anesthesia Plan  ASA: II  Anesthesia Plan: General   Post-op Pain Management:    Induction: Intravenous  PONV Risk Score and Plan: 3 and Treatment may vary due to age or medical condition, Ondansetron, Dexamethasone and Midazolam  Airway Management Planned: LMA  Additional Equipment:   Intra-op Plan:   Post-operative Plan: Extubation in OR  Informed Consent: I have reviewed the patients History and Physical, chart, labs and discussed the procedure including the risks, benefits and alternatives for the proposed anesthesia with the patient or authorized representative who has indicated his/her understanding and acceptance.     Dental advisory given  Plan Discussed with: CRNA  Anesthesia Plan Comments:        Anesthesia Quick Evaluation

## 2018-09-30 NOTE — H&P (Signed)
Kathy Perez is an 53 y.o. female Kathy Perez presents for myosure/possible polypectomy and Novasure ablation.    She has a h/o menorrhagia with very heavy menses lasting 7 days with mild cramping and clots. Has to change large pads during the night and 1-2 hrs in the day time. Hx of anemia in past because of heavy periods. SIUS 06/16/18 with possible posterior sessile polyps vs polypoid endometrium. EMB benign. She reports her DM is well-controlled at 6+--3 months.  Pertinent Gynecological History:  OB History: NSVD x 8   Menstrual History:  Patient's last menstrual period was 08/28/2018.    Past Medical History:  Diagnosis Date  . Anemia   . Cellulitis 06/26/2014   Right nostril, Left arm pit  . DDD (degenerative disc disease), lumbar   . Depression   . Diabetes mellitus without complication (HCC)   . Diarrhea 03/2015  . Fibromyalgia 12/15/2013  . Finger fracture, left 09/25/2015   index, nondisplaced  . GERD (gastroesophageal reflux disease)   . History of bronchitis 06/01/2017  . History of PSVT (paroxysmal supraventricular tachycardia)   . History of short term memory loss    due to depression  . Hyperlipidemia   . Low back pain   . Migraines   . OA (osteoarthritis) of knee    Bilateral  . Otitis media 10/01/2017  . Varicose vein of leg    Bilateral    Past Surgical History:  Procedure Laterality Date  . AV NODE ABLATION  12/29/2013  . CARDIAC CATHETERIZATION  01/2015   Normal Coronaries  . CARDIOVASCULAR STRESS TEST    . VARICOSE VEIN SURGERY Left     Family History  Problem Relation Age of Onset  . Breast cancer Neg Hx     Social History:  reports that she has never smoked. She has never used smokeless tobacco. She reports that she does not drink alcohol or use drugs.  Allergies: No Known Allergies  No medications prior to admission.    Review of Systems  Cardiovascular: Negative for chest pain.  Gastrointestinal: Negative for abdominal pain.   Neurological: Negative for headaches.    Height 5\' 3"  (1.6 m), weight 79.4 kg, last menstrual period 08/28/2018. Physical Exam  Constitutional: She appears well-developed.  Cardiovascular: Normal rate and regular rhythm.  Respiratory: Effort normal.  GI: Soft.  Genitourinary:    Vagina and uterus normal.   Neurological: She is alert.  Psychiatric: She has a normal mood and affect.    No results found for this or any previous visit (from the past 24 hour(s)).  No results found.  Assessment/Plan: The patient was counseled on the [facility] and novasure procedure in detail. We discussed removing any potential polyps first if present then proceeding with ablation. The risks of bleeding and infection and possible uterine perforation were reviewed. We discussed that the procedure will usually reduce bleeding significantly, but may not eliminate periods. We also discussed that she should not perform this procedure if she desires any future pregnancies. She plans no future childbearing. We will proceed in the hospital. She will use cytotec 3 hours prior to procedure Oliver Pila 09/30/2018, 5:55 PM

## 2018-10-01 ENCOUNTER — Encounter (HOSPITAL_BASED_OUTPATIENT_CLINIC_OR_DEPARTMENT_OTHER)
Admission: RE | Disposition: A | Discharge status: Home / Self Care | Payer: Self-pay | Source: Home / Self Care | Attending: Obstetrics and Gynecology

## 2018-10-01 ENCOUNTER — Encounter (HOSPITAL_BASED_OUTPATIENT_CLINIC_OR_DEPARTMENT_OTHER): Payer: Self-pay | Admitting: Anesthesiology

## 2018-10-01 ENCOUNTER — Ambulatory Visit (HOSPITAL_BASED_OUTPATIENT_CLINIC_OR_DEPARTMENT_OTHER)
Admission: RE | Admit: 2018-10-01 | Discharge: 2018-10-01 | Disposition: A | Discharge status: Home / Self Care | Payer: Medicaid Other | Attending: Obstetrics and Gynecology | Admitting: Obstetrics and Gynecology

## 2018-10-01 ENCOUNTER — Encounter (HOSPITAL_BASED_OUTPATIENT_CLINIC_OR_DEPARTMENT_OTHER): Payer: Self-pay

## 2018-10-01 DIAGNOSIS — Z79899 Other long term (current) drug therapy: Secondary | ICD-10-CM | POA: Diagnosis not present

## 2018-10-01 DIAGNOSIS — N920 Excessive and frequent menstruation with regular cycle: Secondary | ICD-10-CM | POA: Diagnosis not present

## 2018-10-01 DIAGNOSIS — K219 Gastro-esophageal reflux disease without esophagitis: Secondary | ICD-10-CM | POA: Insufficient documentation

## 2018-10-01 DIAGNOSIS — E119 Type 2 diabetes mellitus without complications: Secondary | ICD-10-CM | POA: Insufficient documentation

## 2018-10-01 HISTORY — DX: Personal history of other specified conditions: Z87.898

## 2018-10-01 HISTORY — DX: Cellulitis, unspecified: L03.90

## 2018-10-01 HISTORY — DX: Low back pain: M54.5

## 2018-10-01 HISTORY — DX: Fibromyalgia: M79.7

## 2018-10-01 HISTORY — DX: Personal history of other diseases of the respiratory system: Z87.09

## 2018-10-01 HISTORY — DX: Hyperlipidemia, unspecified: E78.5

## 2018-10-01 HISTORY — DX: Otitis media, unspecified, unspecified ear: H66.90

## 2018-10-01 HISTORY — DX: Personal history of other diseases of the circulatory system: Z86.79

## 2018-10-01 HISTORY — DX: Migraine, unspecified, not intractable, without status migrainosus: G43.909

## 2018-10-01 HISTORY — DX: Gastro-esophageal reflux disease without esophagitis: K21.9

## 2018-10-01 HISTORY — DX: Depression, unspecified: F32.A

## 2018-10-01 HISTORY — DX: Unilateral primary osteoarthritis, unspecified knee: M17.10

## 2018-10-01 HISTORY — DX: Fracture of unspecified phalanx of unspecified finger, initial encounter for closed fracture: S62.609A

## 2018-10-01 HISTORY — DX: Osteoarthritis of knee, unspecified: M17.9

## 2018-10-01 HISTORY — DX: Other intervertebral disc degeneration, lumbar region: M51.36

## 2018-10-01 HISTORY — PX: DILITATION & CURRETTAGE/HYSTROSCOPY WITH NOVASURE ABLATION: SHX5568

## 2018-10-01 HISTORY — DX: Anemia, unspecified: D64.9

## 2018-10-01 HISTORY — DX: Major depressive disorder, single episode, unspecified: F32.9

## 2018-10-01 HISTORY — DX: Diarrhea, unspecified: R19.7

## 2018-10-01 HISTORY — DX: Other intervertebral disc degeneration, lumbar region without mention of lumbar back pain or lower extremity pain: M51.369

## 2018-10-01 HISTORY — DX: Low back pain, unspecified: M54.50

## 2018-10-01 HISTORY — DX: Asymptomatic varicose veins of unspecified lower extremity: I83.90

## 2018-10-01 LAB — CBC
HCT: 39.9 % (ref 36.0–46.0)
Hemoglobin: 12.4 g/dL (ref 12.0–15.0)
MCH: 28.5 pg (ref 26.0–34.0)
MCHC: 31.1 g/dL (ref 30.0–36.0)
MCV: 91.7 fL (ref 80.0–100.0)
Platelets: 340 10*3/uL (ref 150–400)
RBC: 4.35 MIL/uL (ref 3.87–5.11)
RDW: 15 % (ref 11.5–15.5)
WBC: 6.6 10*3/uL (ref 4.0–10.5)
nRBC: 0 % (ref 0.0–0.2)

## 2018-10-01 LAB — BASIC METABOLIC PANEL
Anion gap: 7 (ref 5–15)
BUN: 12 mg/dL (ref 6–20)
CALCIUM: 9.1 mg/dL (ref 8.9–10.3)
CO2: 27 mmol/L (ref 22–32)
Chloride: 105 mmol/L (ref 98–111)
Creatinine, Ser: 0.58 mg/dL (ref 0.44–1.00)
GFR calc non Af Amer: >60 mL/min (ref >60)
Glucose, Bld: 130 mg/dL — ABNORMAL HIGH (ref 70–99)
Potassium: 3.6 mmol/L (ref 3.5–5.1)
SODIUM: 139 mmol/L (ref 135–145)

## 2018-10-01 LAB — GLUCOSE, CAPILLARY: Glucose-Capillary: 118 mg/dL — ABNORMAL HIGH (ref 70–99)

## 2018-10-01 LAB — ABO/RH: ABO/RH(D): A POS

## 2018-10-01 LAB — HEMOGLOBIN A1C
Hgb A1c MFr Bld: 6.5 % — ABNORMAL HIGH (ref 4.8–5.6)
Mean Plasma Glucose: 139.85 mg/dL

## 2018-10-01 LAB — TYPE AND SCREEN
ABO/RH(D): A POS
Antibody Screen: NEGATIVE

## 2018-10-01 LAB — POCT PREGNANCY, URINE: PREG TEST UR: NEGATIVE

## 2018-10-01 SURGERY — DILATATION & CURETTAGE/HYSTEROSCOPY WITH NOVASURE ABLATION
Anesthesia: General

## 2018-10-01 MED ORDER — LIDOCAINE 2% (20 MG/ML) 5 ML SYRINGE
INTRAMUSCULAR | Status: AC
  Filled 2018-10-01: qty 5

## 2018-10-01 MED ORDER — ONDANSETRON HCL 4 MG/2ML IJ SOLN
INTRAMUSCULAR | Status: AC
  Filled 2018-10-01: qty 2

## 2018-10-01 MED ORDER — KETOROLAC TROMETHAMINE 30 MG/ML IJ SOLN
INTRAMUSCULAR | Status: DC | PRN
  Administered 2018-10-01: 30 mg via INTRAVENOUS

## 2018-10-01 MED ORDER — OXYCODONE HCL 5 MG PO TABS
5.0000 mg | ORAL_TABLET | Freq: Once | ORAL | Status: AC | PRN
  Administered 2018-10-01: 5 mg via ORAL
  Filled 2018-10-01: qty 1

## 2018-10-01 MED ORDER — LIDOCAINE HCL 1 % IJ SOLN
INTRAMUSCULAR | Status: DC | PRN
  Administered 2018-10-01: 20 mL

## 2018-10-01 MED ORDER — LACTATED RINGERS IV SOLN
INTRAVENOUS | Status: DC
  Administered 2018-10-01: 07:00:00 via INTRAVENOUS
  Filled 2018-10-01: qty 1000

## 2018-10-01 MED ORDER — DEXAMETHASONE SODIUM PHOSPHATE 10 MG/ML IJ SOLN
INTRAMUSCULAR | Status: AC
  Filled 2018-10-01: qty 1

## 2018-10-01 MED ORDER — ACETAMINOPHEN 10 MG/ML IV SOLN
1000.0000 mg | Freq: Once | INTRAVENOUS | Status: DC | PRN
  Filled 2018-10-01: qty 100

## 2018-10-01 MED ORDER — OXYCODONE HCL 5 MG/5ML PO SOLN
5.0000 mg | Freq: Once | ORAL | Status: AC | PRN
  Filled 2018-10-01: qty 5

## 2018-10-01 MED ORDER — OXYCODONE HCL 5 MG PO TABS
ORAL_TABLET | ORAL | Status: AC
  Filled 2018-10-01: qty 1

## 2018-10-01 MED ORDER — LIDOCAINE HCL 1 % IJ SOLN
INTRAMUSCULAR | Status: DC | PRN
  Administered 2018-10-01: 60 mg via INTRADERMAL

## 2018-10-01 MED ORDER — MIDAZOLAM HCL 5 MG/5ML IJ SOLN
INTRAMUSCULAR | Status: DC | PRN
  Administered 2018-10-01 (×2): 1 mg via INTRAVENOUS

## 2018-10-01 MED ORDER — PROPOFOL 10 MG/ML IV BOLUS
INTRAVENOUS | Status: AC
  Filled 2018-10-01: qty 40

## 2018-10-01 MED ORDER — FENTANYL CITRATE (PF) 100 MCG/2ML IJ SOLN
INTRAMUSCULAR | Status: AC
  Filled 2018-10-01: qty 2

## 2018-10-01 MED ORDER — IBUPROFEN 200 MG PO TABS: 600.0000 mg | ORAL_TABLET | Freq: Four times a day (QID) | ORAL | 0 refills | Status: AC | PRN

## 2018-10-01 MED ORDER — OXYCODONE HCL 5 MG PO TABS: ORAL_TABLET | ORAL | 0 refills | Status: AC

## 2018-10-01 MED ORDER — ONDANSETRON 8 MG PO TBDP: 8.0000 mg | ORAL_TABLET | Freq: Three times a day (TID) | ORAL | 0 refills | Status: AC | PRN

## 2018-10-01 MED ORDER — PROPOFOL 10 MG/ML IV BOLUS
INTRAVENOUS | Status: DC | PRN
  Administered 2018-10-01: 150 mg via INTRAVENOUS

## 2018-10-01 MED ORDER — PROMETHAZINE HCL 25 MG/ML IJ SOLN: 6.2500 mg | INTRAMUSCULAR | Status: DC | PRN

## 2018-10-01 MED ORDER — MIDAZOLAM HCL 2 MG/2ML IJ SOLN
INTRAMUSCULAR | Status: AC
  Filled 2018-10-01: qty 2

## 2018-10-01 MED ORDER — ONDANSETRON HCL 4 MG/2ML IJ SOLN
INTRAMUSCULAR | Status: DC | PRN
  Administered 2018-10-01: 4 mg via INTRAVENOUS

## 2018-10-01 MED ORDER — FENTANYL CITRATE (PF) 100 MCG/2ML IJ SOLN
INTRAMUSCULAR | Status: DC | PRN
  Administered 2018-10-01 (×3): 50 ug via INTRAVENOUS

## 2018-10-01 MED ORDER — FENTANYL CITRATE (PF) 100 MCG/2ML IJ SOLN
25.0000 ug | INTRAMUSCULAR | Status: DC | PRN
  Filled 2018-10-01: qty 1

## 2018-10-01 SURGICAL SUPPLY — 17 items
ABLATOR SURESOUND NOVASURE (ABLATOR) ×2 IMPLANT
CANISTER SUCT 3000ML PPV (MISCELLANEOUS) ×2 IMPLANT
CATH ROBINSON RED A/P 16FR (CATHETERS) ×2 IMPLANT
CLOTH BEACON ORANGE TIMEOUT ST (SAFETY) ×2 IMPLANT
CONTAINER PREFILL 10% NBF 60ML (FORM) ×4 IMPLANT
COVER WAND RF STERILE (DRAPES) ×2 IMPLANT
ELECT REM PT RETURN 9FT ADLT (ELECTROSURGICAL)
ELECTRODE REM PT RTRN 9FT ADLT (ELECTROSURGICAL) IMPLANT
GAUZE 4X4 16PLY RFD (DISPOSABLE) ×2 IMPLANT
GLOVE BIO SURGEON STRL SZ7 (GLOVE) ×2 IMPLANT
GOWN STRL REUS W/TWL LRG LVL3 (GOWN DISPOSABLE) ×4 IMPLANT
IV LACTATED RINGER IRRG 3000ML (IV SOLUTION) ×1
IV LR IRRIG 3000ML ARTHROMATIC (IV SOLUTION) ×1 IMPLANT
KIT PROCEDURE FLUENT (KITS) ×2 IMPLANT
PACK VAGINAL MINOR WOMEN LF (CUSTOM PROCEDURE TRAY) ×2 IMPLANT
PAD OB MATERNITY 4.3X12.25 (PERSONAL CARE ITEMS) ×2 IMPLANT
TOWEL OR 17X26 10 PK STRL BLUE (TOWEL DISPOSABLE) ×4 IMPLANT

## 2018-10-01 NOTE — Anesthesia Procedure Notes (Signed)
Procedure Name: LMA Insertion Date/Time: 10/01/2018 7:34 AM Performed by: Garth Bigness, CRNA Pre-anesthesia Checklist: Patient identified, Emergency Drugs available, Suction available and Patient being monitored Patient Re-evaluated:Patient Re-evaluated prior to induction Oxygen Delivery Method: Circle system utilized Preoxygenation: Pre-oxygenation with 100% oxygen Induction Type: IV induction Ventilation: Mask ventilation without difficulty LMA: LMA inserted LMA Size: 3.0 Placement Confirmation: positive ETCO2 and breath sounds checked- equal and bilateral Tube secured with: Tape Dental Injury: Teeth and Oropharynx as per pre-operative assessment

## 2018-10-01 NOTE — Anesthesia Postprocedure Evaluation (Signed)
Anesthesia Post Note  Patient: Kathy Perez  Procedure(s) Performed: DILATATION & CURETTAGE/HYSTEROSCOPY WITH NOVASURE ABLATION, polypectomy (N/A )     Patient location during evaluation: PACU Anesthesia Type: General Level of consciousness: awake and alert Pain management: pain level controlled Vital Signs Assessment: post-procedure vital signs reviewed and stable Respiratory status: spontaneous breathing, nonlabored ventilation and respiratory function stable Cardiovascular status: blood pressure returned to baseline and stable Postop Assessment: no apparent nausea or vomiting Anesthetic complications: no    Last Vitals:  Vitals:   10/01/18 0845 10/01/18 0935  BP: 120/82 115/79  Pulse: 69 72  Resp: (!) 23 (!) 24  Temp:  36.8 C  SpO2: 95% 98%    Last Pain:  Vitals:   10/01/18 1015  TempSrc:   PainSc: 2                  Kaylyn Layer

## 2018-10-01 NOTE — Op Note (Signed)
Operative Note    Preoperative Diagnosis Menorrhagia  Postoperative Diagnosis same  Procedure Hysteroscopy with Novasure endometrial ablation  Surgeon Huel Cote, MD  Anesthesia LMA  Fluids: EBL 36mL UOP straight cath prior IVF LR Hysteroscopic deficit  Findings Normal endometrial cavity, slightly atrophic c/w finishing menses  Specimen None  Procedure Note Patient was taken to the operating room where LMA anesthesia was obtained without difficulty. She was then prepped and draped in the normal sterile fashion in the dorsal lithotomy position. An appropriate time out was performed. A speculum was then placed within the vagina and the anterior lip of the cervix identified and injected with approximately 2 cc of 1% plain lidocaine. An additional 9 cc each was placed at 2 and 10:00 for a paracervical block. Uterus was then sounded to 11 cm and the cervical length measured at 4 cm--so the device was set for a length of 6.5.  The Pratt dilators utilized to dilate the cervix up to approximately 24. The hysteroscope was introduced into the cavity and the findings noted as previously stated.  The hysteroscope was then removed and the Novasure device inserted to the top of the fundus and deployed.  A cavity width of 4.8 noted.  The device was activated with a treatment time of 60 secs. The hysteroscope was then replaced and the cavity noted to have a good treatment effect with blanching and no viable endometrium apparent.  All instruments were removed from the vagina.  The tenaculum site was hemostatic.  Finally the speculum was removed from the vagina and the patient awakened and taken to the recovery room in good condition.

## 2018-10-01 NOTE — Transfer of Care (Signed)
Immediate Anesthesia Transfer of Care Note  Patient: Kathy Perez  Procedure(s) Performed: DILATATION & CURETTAGE/HYSTEROSCOPY WITH NOVASURE ABLATION, polypectomy (N/A )  Patient Location: PACU  Anesthesia Type:General  Level of Consciousness: awake, alert , oriented and patient cooperative  Airway & Oxygen Therapy: Patient Spontanous Breathing and Patient connected to face mask oxygen  Post-op Assessment: Report given to RN and Post -op Vital signs reviewed and stable  Post vital signs: Reviewed  Last Vitals:  Vitals Value Taken Time  BP 144/100 10/01/2018  8:15 AM  Temp    Pulse 76 10/01/2018  8:17 AM  Resp 17 10/01/2018  8:17 AM  SpO2 100 % 10/01/2018  8:17 AM  Vitals shown include unvalidated device data.  Last Pain:  Vitals:   10/01/18 0549  TempSrc: Oral  PainSc: 0-No pain      Patients Stated Pain Goal: 5 (10/01/18 0549)  Complications: No apparent anesthesia complications

## 2018-10-01 NOTE — Discharge Instructions (Signed)
  Post Anesthesia Home Care Instructions  Activity: Get plenty of rest for the remainder of the day. A responsible adult should stay with you for 24 hours following the procedure.  For the next 24 hours, DO NOT: -Drive a car -Operate machinery -Drink alcoholic beverages -Take any medication unless instructed by your physician -Make any legal decisions or sign important papers.  Meals: Start with liquid foods such as gelatin or soup. Progress to regular foods as tolerated. Avoid greasy, spicy, heavy foods. If nausea and/or vomiting occur, drink only clear liquids until the nausea and/or vomiting subsides. Call your physician if vomiting continues.  Special Instructions/Symptoms: Your throat may feel dry or sore from the anesthesia or the breathing tube placed in your throat during surgery. If this causes discomfort, gargle with warm salt water. The discomfort should disappear within 24 hours.  If you had a scopolamine patch placed behind your ear for the management of post- operative nausea and/or vomiting:  1. The medication in the patch is effective for 72 hours, after which it should be removed.  Wrap patch in a tissue and discard in the trash. Wash hands thoroughly with soap and water. 2. You may remove the patch earlier than 72 hours if you experience unpleasant side effects which may include dry mouth, dizziness or visual disturbances. 3. Avoid touching the patch. Wash your hands with soap and water after contact with the patch.     D & C Home care Instructions:   Personal hygiene:  Used sanitary napkins for vaginal drainage not tampons. Shower or tub bathe the day after your procedure. No douching until bleeding stops. Always wipe from front to back after  Elimination.  Activity: Do not drive or operate any equipment today. The effects of the anesthesia are still present and drowsiness may result. Rest today, not necessarily flat bed rest, just take it easy. You may resume your  normal activity in one to 2 days.  Sexual activity: No intercourse for one week or as indicated by your physician  Diet: Eat a light diet as desired this evening. You may resume a regular diet tomorrow.  Return to work: One to 2 days.  General Expectations of your surgery: Vaginal bleeding should be no heavier than a normal period. Spotting may continue up to 10 days. Mild cramps may continue for a couple of days. You may have a regular period in 2-6 weeks.  Unexpected observations call your doctor if these occur: persistent or heavy bleeding. Severe abdominal cramping or pain. Elevation of temperature greater than 100F.  Call for an appointment in one week.  

## 2018-10-04 ENCOUNTER — Encounter (HOSPITAL_BASED_OUTPATIENT_CLINIC_OR_DEPARTMENT_OTHER): Payer: Self-pay | Admitting: Obstetrics and Gynecology

## 2019-01-03 ENCOUNTER — Other Ambulatory Visit: Payer: Self-pay | Admitting: Family Medicine

## 2019-01-03 ENCOUNTER — Other Ambulatory Visit: Payer: Self-pay | Admitting: Physician Assistant

## 2019-01-03 DIAGNOSIS — Z1231 Encounter for screening mammogram for malignant neoplasm of breast: Secondary | ICD-10-CM

## 2019-02-21 ENCOUNTER — Ambulatory Visit: Payer: Medicaid Other

## 2019-03-02 ENCOUNTER — Ambulatory Visit: Payer: Medicaid Other

## 2019-04-21 ENCOUNTER — Other Ambulatory Visit: Payer: Self-pay

## 2019-04-21 ENCOUNTER — Ambulatory Visit
Admission: RE | Admit: 2019-04-21 | Discharge: 2019-04-21 | Disposition: A | Discharge status: Home / Self Care | Payer: Medicaid Other | Source: Ambulatory Visit | Attending: Physician Assistant | Admitting: Physician Assistant

## 2019-04-21 DIAGNOSIS — Z1231 Encounter for screening mammogram for malignant neoplasm of breast: Secondary | ICD-10-CM

## 2020-03-19 ENCOUNTER — Other Ambulatory Visit: Payer: Self-pay | Admitting: Physician Assistant

## 2020-03-19 DIAGNOSIS — Z1231 Encounter for screening mammogram for malignant neoplasm of breast: Secondary | ICD-10-CM

## 2020-04-23 ENCOUNTER — Ambulatory Visit
Admission: RE | Admit: 2020-04-23 | Discharge: 2020-04-23 | Disposition: A | Discharge status: Home / Self Care | Payer: Medicaid Other | Source: Ambulatory Visit | Attending: Physician Assistant | Admitting: Physician Assistant

## 2020-04-23 ENCOUNTER — Other Ambulatory Visit: Payer: Self-pay

## 2020-04-23 DIAGNOSIS — Z1231 Encounter for screening mammogram for malignant neoplasm of breast: Secondary | ICD-10-CM

## 2020-07-09 ENCOUNTER — Other Ambulatory Visit: Payer: Self-pay

## 2020-07-09 ENCOUNTER — Ambulatory Visit
Admission: RE | Admit: 2020-07-09 | Discharge: 2020-07-09 | Disposition: A | Discharge status: Home / Self Care | Payer: Medicaid Other | Source: Ambulatory Visit | Attending: Physician Assistant | Admitting: Physician Assistant

## 2021-04-13 ENCOUNTER — Emergency Department (HOSPITAL_COMMUNITY)
Admission: EM | Admit: 2021-04-13 | Discharge: 2021-04-14 | Disposition: A | Discharge status: Home / Self Care | Payer: Medicaid Other | Attending: Emergency Medicine | Admitting: Emergency Medicine

## 2021-04-13 ENCOUNTER — Other Ambulatory Visit: Payer: Self-pay

## 2021-04-13 ENCOUNTER — Encounter (HOSPITAL_COMMUNITY): Payer: Self-pay | Admitting: Emergency Medicine

## 2021-04-13 DIAGNOSIS — M5431 Sciatica, right side: Secondary | ICD-10-CM

## 2021-04-13 DIAGNOSIS — M545 Low back pain, unspecified: Secondary | ICD-10-CM | POA: Diagnosis present

## 2021-04-13 DIAGNOSIS — E119 Type 2 diabetes mellitus without complications: Secondary | ICD-10-CM | POA: Insufficient documentation

## 2021-04-13 DIAGNOSIS — Z7984 Long term (current) use of oral hypoglycemic drugs: Secondary | ICD-10-CM | POA: Insufficient documentation

## 2021-04-13 LAB — CBC WITH DIFFERENTIAL/PLATELET
Abs Immature Granulocytes: 0.05 10*3/uL (ref 0.00–0.07)
Basophils Absolute: 0.1 10*3/uL (ref 0.0–0.1)
Basophils Relative: 1 %
Eosinophils Absolute: 0.1 10*3/uL (ref 0.0–0.5)
Eosinophils Relative: 1 %
HCT: 37.4 % (ref 36.0–46.0)
Hemoglobin: 11.9 g/dL — ABNORMAL LOW (ref 12.0–15.0)
Immature Granulocytes: 0 %
Lymphocytes Relative: 15 %
Lymphs Abs: 1.8 10*3/uL (ref 0.7–4.0)
MCH: 29.2 pg (ref 26.0–34.0)
MCHC: 31.8 g/dL (ref 30.0–36.0)
MCV: 91.7 fL (ref 80.0–100.0)
Monocytes Absolute: 0.5 10*3/uL (ref 0.1–1.0)
Monocytes Relative: 4 %
Neutro Abs: 9.3 10*3/uL — ABNORMAL HIGH (ref 1.7–7.7)
Neutrophils Relative %: 79 %
Platelets: 314 10*3/uL (ref 150–400)
RBC: 4.08 MIL/uL (ref 3.87–5.11)
RDW: 13.9 % (ref 11.5–15.5)
WBC: 11.8 10*3/uL — ABNORMAL HIGH (ref 4.0–10.5)
nRBC: 0 % (ref 0.0–0.2)

## 2021-04-13 LAB — COMPREHENSIVE METABOLIC PANEL
ALT: 20 U/L (ref 0–44)
AST: 18 U/L (ref 15–41)
Albumin: 3.8 g/dL (ref 3.5–5.0)
Alkaline Phosphatase: 54 U/L (ref 38–126)
Anion gap: 10 (ref 5–15)
BUN: 11 mg/dL (ref 6–20)
CO2: 26 mmol/L (ref 22–32)
Calcium: 9.1 mg/dL (ref 8.9–10.3)
Chloride: 104 mmol/L (ref 98–111)
Creatinine, Ser: 0.68 mg/dL (ref 0.44–1.00)
GFR, Estimated: >60 mL/min (ref >60)
Glucose, Bld: 203 mg/dL — ABNORMAL HIGH (ref 70–99)
Potassium: 4.2 mmol/L (ref 3.5–5.1)
Sodium: 140 mmol/L (ref 135–145)
Total Bilirubin: 0.3 mg/dL (ref 0.3–1.2)
Total Protein: 7.1 g/dL (ref 6.5–8.1)

## 2021-04-13 LAB — LIPASE, BLOOD: Lipase: 35 U/L (ref 11–51)

## 2021-04-13 NOTE — ED Triage Notes (Signed)
Patient arrives complaining of sciatica symptoms. Patient states right hip and lower back pain that radiates down her leg. Patient seen at [facility] and provided prescriptions without relief. Patient is ambulatory

## 2021-04-13 NOTE — ED Provider Notes (Signed)
Emergency Medicine Provider Triage Evaluation Note  Kathy Perez , a 55 y.o. female  was evaluated in triage.  Pt complains of right hip and back pain that radiates down her leg.  She states it stops at the knee.  She states this started two days ago.  She has pain in her abdomen.  She denies any chest pain or fevers.  No numbness in the leg.   Review of Systems  Positive: Abdominal pain, right sided hip and back pain.  Negative: Fever  Physical Exam  BP (!) 151/88 (BP Location: Left Arm)   Pulse 65   Temp 98.4 F (36.9 C) (Oral)   Resp 18   Ht 5\' 4"  (1.626 m)   Wt 72.6 kg   SpO2 99%   BMI 27.46 kg/m  Gen:   Awake, no distress   Resp:  Normal effort  MSK:   Moves extremities without difficulty  Other:  Abdomen is TTP diffusely, primarily on right side.  2+ DP/PT pulse right foot.  Right leg is warm.   Medical Decision Making  Medically screening exam initiated at 9:48 PM.  Appropriate orders placed.  SURABHI GADEA was informed that the remainder of the evaluation will be completed by another provider, this initial triage assessment does not replace that evaluation, and the importance of remaining in the ED until their evaluation is complete.     Ottie Glazier 04/13/21 2151    2152, MD 04/13/21 2253

## 2021-04-14 ENCOUNTER — Emergency Department (HOSPITAL_COMMUNITY): Payer: Medicaid Other

## 2021-04-14 ENCOUNTER — Emergency Department (HOSPITAL_COMMUNITY)
Admission: RE | Admit: 2021-04-14 | Discharge: 2021-04-14 | Disposition: A | Discharge status: Home / Self Care | Payer: Medicaid Other | Source: Ambulatory Visit | Attending: Emergency Medicine | Admitting: Emergency Medicine

## 2021-04-14 MED ORDER — OXYCODONE-ACETAMINOPHEN 5-325 MG PO TABS
1.0000 | ORAL_TABLET | Freq: Once | ORAL | Status: AC
  Administered 2021-04-14: 1 via ORAL
  Filled 2021-04-14: qty 1

## 2021-04-14 MED ORDER — METHOCARBAMOL 500 MG PO TABS
500.0000 mg | ORAL_TABLET | Freq: Once | ORAL | Status: AC
  Administered 2021-04-14: 500 mg via ORAL
  Filled 2021-04-14: qty 1

## 2021-04-14 NOTE — Discharge Instructions (Addendum)
Medications: Continue Mobic as prescribed; take 2 cyclobenzaprine tablets (10 mg total) 2-3 times daily; take 2 hydrocodone tablets (10 mg/650 mg total) every 4-6 hours for pain as needed.   Recommend warm compresses to the back as instructed.   Return for MRI in the morning at 10:00. You can follow up with Dr. Jena Gauss (orthopedics) for further management of back pain.  Return to the ED at any time if your symptoms worsen or for new concern.

## 2021-04-14 NOTE — ED Notes (Signed)
Pt d/c home per MD order. Discharge summary reviewed with pt, pt verbalizes understanding. No s/s of acute distress noted at discharge. Off unit via WC. Discharged home with son.  

## 2021-04-14 NOTE — ED Provider Notes (Signed)
[facility]-EMERGENCY DEPT Provider Note   CSN: [id number] Arrival date & time: 04/13/21  2102     History Chief Complaint  Patient presents with   Leg Pain   Back Pain    Kathy Perez is a 55 y.o. female.  Patient to ED with complaint of low back pain radiates to right hip and lateral right thigh. No bowel/bladder dysfunction. Pain started 2 days ago but she reports having similar, less intense pain in the past. Taking Naproxen without relief. No fever, dysuria, falls, numbness.   The history is provided by the patient and a relative. No language interpreter was used.  Leg Pain Associated symptoms: back pain   Associated symptoms: no fever   Back Pain Associated symptoms: leg pain   Associated symptoms: no abdominal pain, no chest pain, no dysuria, no fever, no numbness and no weakness       Past Medical History:  Diagnosis Date   Anemia    Cellulitis 06/26/2014   Right nostril, Left arm pit   DDD (degenerative disc disease), lumbar    Depression    Diabetes mellitus without complication (HCC)    Diarrhea 03/2015   Fibromyalgia 12/15/2013   Finger fracture, left 09/25/2015   index, nondisplaced   GERD (gastroesophageal reflux disease)    History of bronchitis 06/01/2017   History of PSVT (paroxysmal supraventricular tachycardia)    History of short term memory loss    due to depression   Hyperlipidemia    Low back pain    Migraines    OA (osteoarthritis) of knee    Bilateral   Otitis media 10/01/2017   Varicose vein of leg    Bilateral    Patient Active Problem List   Diagnosis Date Noted   Caregiver stress 04/02/2017   Depression 04/02/2017   Finger fracture, left 10/09/2015   Hyperlipidemia 09/24/2015   Type 2 diabetes mellitus (HCC) 06/15/2015   Acid reflux 06/15/2015   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/25/2015   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   Amnesia 04/05/2014   Back ache 11/11/2013   Fibromyalgia 09/12/2013    Gastrointestinal ulcer due to Helicobacter pylori 09/12/2013   Gonalgia 09/12/2013   Arthritis, degenerative 09/12/2013   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   Hand paresthesia 08/26/2013   Absolute anemia 08/16/2013   Allergic rhinitis 09/09/2011    Past Surgical History:  Procedure Laterality Date   AV NODE ABLATION  12/29/2013   CARDIAC CATHETERIZATION  01/2015   Normal Coronaries   CARDIOVASCULAR STRESS TEST     DILITATION & CURRETTAGE/HYSTROSCOPY WITH NOVASURE ABLATION N/A 10/01/2018   Procedure: DILATATION & CURETTAGE/HYSTEROSCOPY WITH NOVASURE ABLATION, polypectomy;  Surgeon: Huel Cote, MD;  Location: [facility] [place];  Service: Gynecology;  Laterality: N/A;   VARICOSE VEIN SURGERY Left      OB History   No obstetric history on file.     Family History  Problem Relation Age of Onset   Breast cancer Neg Hx     Social History   Tobacco Use   Smoking status: Never   Smokeless tobacco: Never  Vaping Use   Vaping Use: Never used  Substance Use Topics   Alcohol use: No   Drug use: No    Home Medications Prior to Admission medications   Medication Sig Start Date End Date Taking? Authorizing Provider  atorvastatin (LIPITOR) 20 MG tablet Take 20 mg by mouth daily. 04/03/21  Yes [provider]  cyclobenzaprine (FLEXERIL) 5 MG tablet Take 5 mg  by mouth 3 (three) times daily as needed. 04/12/21  Yes [provider]  gabapentin (NEURONTIN) 300 MG capsule Take 1 capsule by mouth 3 (three) times daily. 06/02/19  Yes [provider]  HYDROcodone-acetaminophen (NORCO/VICODIN) 5-325 MG tablet Take 1 tablet by mouth every 4 (four) hours as needed. 04/12/21  Yes [provider]  meclizine (ANTIVERT) 25 MG tablet Take 25 mg by mouth 3 (three) times daily as needed. 04/12/21  Yes [provider]  meloxicam (MOBIC) 7.5 MG tablet Take 7.5 mg by mouth 2 (two) times daily as needed. 04/12/21  Yes [provider]  metFORMIN (GLUCOPHAGE) 500 MG tablet TAKE 1 TABLET BY MOUTH 2 TIMES DAILY WITH A MEAL. 10/01/17  Yes Georgian Co M, PA-C  metFORMIN (GLUCOPHAGE) 500 MG tablet Take 500 mg by mouth in the morning and at bedtime. 02/08/18  Yes [provider]  PARoxetine (PAXIL) 10 MG tablet Take 10 mg by mouth daily. 03/22/21  Yes [provider]  pravastatin (PRAVACHOL) 40 MG tablet Take 1 tablet (40 mg total) by mouth every evening. 04/02/17  Yes Newlin, Odette Horns, MD  fluticasone (FLONASE) 50 MCG/ACT nasal spray Place 2 sprays into both nostrils daily. Patient not taking: Reported on 04/13/2021 06/01/17   Anders Simmonds, PA-C  ibuprofen (MOTRIN IB) 200 MG tablet Take 3 tablets (600 mg total) by mouth every 6 (six) hours as needed. Patient not taking: Reported on 04/13/2021 10/01/18   Huel Cote, MD  ondansetron (ZOFRAN ODT) 8 MG disintegrating tablet Take 1 tablet (8 mg total) by mouth every 8 (eight) hours as needed for nausea or vomiting. Patient not taking: Reported on 04/13/2021 10/01/18   Huel Cote, MD  oxyCODONE (ROXICODONE) 5 MG immediate release tablet May take one tablet as needed every 3 hours Patient not taking: Reported on 04/13/2021 10/01/18   Huel Cote, MD    Allergies    Patient has no known allergies.  Review of Systems   Review of Systems  Constitutional:  Negative for fever.  Respiratory:  Negative for shortness of breath.   Cardiovascular:  Negative for chest pain.  Gastrointestinal:  Negative for abdominal pain and vomiting.  Genitourinary:  Negative for dysuria and enuresis.  Musculoskeletal:  Positive for back pain.  Neurological:  Negative for weakness and numbness.   Physical Exam Updated Vital Signs BP 121/73   Pulse 70   Temp 98.2 F (36.8 C) (Oral)   Resp 18   Ht 5\' 4"  (1.626 m)   Wt 72.6 kg   SpO2 97%   BMI 27.46 kg/m   Physical Exam Constitutional:      General: She is not in acute distress.    Appearance: She is  well-developed. She is not ill-appearing.  Pulmonary:     Effort: Pulmonary effort is normal.  Abdominal:     Palpations: Abdomen is soft.     Tenderness: There is no abdominal tenderness.  Musculoskeletal:        General: Normal range of motion.     Cervical back: Normal range of motion.     Comments: Midline lumbar, right paralumbar and buttock tenderness to palpation. No swelling. Distal pulses present. No strength deficits.   Skin:    General: Skin is warm and dry.  Neurological:     Mental Status: She is alert and oriented to person, place, and time.     Sensory: No sensory deficit.     Deep Tendon Reflexes: Reflexes normal.    ED Results / Procedures /  Treatments   Labs (all labs ordered are listed, but only abnormal results are displayed) Labs Reviewed  COMPREHENSIVE METABOLIC PANEL - Abnormal; Notable for the following components:      Result Value   Glucose, Bld 203 (*)    All other components within normal limits  CBC WITH DIFFERENTIAL/PLATELET - Abnormal; Notable for the following components:   WBC 11.8 (*)    Hemoglobin 11.9 (*)    Neutro Abs 9.3 (*)    All other components within normal limits  LIPASE, BLOOD   Results for orders placed or performed during the hospital encounter of 04/13/21  Comprehensive metabolic panel  Result Value Ref Range   Sodium 140 135 - 145 mmol/L   Potassium 4.2 3.5 - 5.1 mmol/L   Chloride 104 98 - 111 mmol/L   CO2 26 22 - 32 mmol/L   Glucose, Bld 203 (H) 70 - 99 mg/dL   BUN 11 6 - 20 mg/dL   Creatinine, Ser 2.82 0.44 - 1.00 mg/dL   Calcium 9.1 8.9 - 06.0 mg/dL   Total Protein 7.1 6.5 - 8.1 g/dL   Albumin 3.8 3.5 - 5.0 g/dL   AST 18 15 - 41 U/L   ALT 20 0 - 44 U/L   Alkaline Phosphatase 54 38 - 126 U/L   Total Bilirubin 0.3 0.3 - 1.2 mg/dL   GFR, Estimated >15 >61 mL/min   Anion gap 10 5 - 15  Lipase, blood  Result Value Ref Range   Lipase 35 11 - 51 U/L  CBC with Differential  Result Value Ref Range   WBC 11.8 (H) 4.0 -  10.5 K/uL   RBC 4.08 3.87 - 5.11 MIL/uL   Hemoglobin 11.9 (L) 12.0 - 15.0 g/dL   HCT 53.7 94.3 - 27.6 %   MCV 91.7 80.0 - 100.0 fL   MCH 29.2 26.0 - 34.0 pg   MCHC 31.8 30.0 - 36.0 g/dL   RDW 14.7 09.2 - 95.7 %   Platelets 314 150 - 400 K/uL   nRBC 0.0 0.0 - 0.2 %   Neutrophils Relative % 79 %   Neutro Abs 9.3 (H) 1.7 - 7.7 K/uL   Lymphocytes Relative 15 %   Lymphs Abs 1.8 0.7 - 4.0 K/uL   Monocytes Relative 4 %   Monocytes Absolute 0.5 0.1 - 1.0 K/uL   Eosinophils Relative 1 %   Eosinophils Absolute 0.1 0.0 - 0.5 K/uL   Basophils Relative 1 %   Basophils Absolute 0.1 0.0 - 0.1 K/uL   Immature Granulocytes 0 %   Abs Immature Granulocytes 0.05 0.00 - 0.07 K/uL     EKG None  Radiology MR LUMBAR SPINE WO CONTRAST  Result Date: 04/14/2021 CLINICAL DATA:  Chronic low back pain radiating into the right leg, worse over the past 3 days. EXAM: MRI LUMBAR SPINE WITHOUT CONTRAST TECHNIQUE: Multiplanar, multisequence MR imaging of the lumbar spine was performed. No intravenous contrast was administered. COMPARISON:  Lumbar spine x-rays dated April 14, 2021. FINDINGS: Segmentation:  Standard. Alignment:  Trace anterolisthesis at L3-L4. Vertebrae:  No fracture, evidence of discitis, or bone lesion. Conus medullaris and cauda equina: Conus extends to the L1-L2 level. Conus and cauda equina appear normal. Paraspinal and other soft tissues: Negative. Disc levels: T12-L1:  Negative. L1-L2:  Negative. L2-L3:  Small shallow right foraminal disc protrusion.  No stenosis. L3-L4: Mild disc bulging and bilateral facet arthropathy. No stenosis. L4-L5: Mild disc bulging and bilateral facet arthropathy. Mild left neuroforaminal stenosis. No spinal  canal or right neuroforaminal stenosis. L5-S1: Small right foraminal disc protrusion and moderate right facet arthropathy with impingement of the inferiorly positioned L5 nerve root within the neural foramen (series 5, image 6). No spinal canal or left  neuroforaminal stenosis. IMPRESSION: 1. Small right foraminal disc protrusion and moderate right facet arthropathy at L5-S1 with impingement of the exiting right L5 nerve root. 2. Mild spondylosis throughout the remaining lumbar spine as described above without high-grade stenosis or impingement. Electronically Signed   By: Obie DredgeWilliam T Derry M.D.   On: 04/14/2021 11:59    Procedures Procedures   Medications Ordered in ED Medications  oxyCODONE-acetaminophen (PERCOCET/ROXICET) 5-325 MG per tablet 1 tablet (1 tablet Oral Given 04/14/21 0007)  methocarbamol (ROBAXIN) tablet 500 mg (500 mg Oral Given 04/14/21 0007)    ED Course  I have reviewed the triage vital signs and the nursing notes.  Pertinent labs & imaging results that were available during my care of the patient were reviewed by me and considered in my medical decision making (see chart for details).    MDM Rules/Calculators/A&P                           Patient to ED with right lower back and sciatica without neurologic deficits on exam. Pain addresses with Percocet and Robaxin.   Lumbar plain film negative. Pain is improved with medications. She is able to move above with more ease. MR discussed and will schedule for outpatient study, refer to neurosurgery for further evaluation.   Final Clinical Impression(s) / ED Diagnoses Final diagnoses:  Sciatica of right side    Rx / DC Orders ED Discharge Orders          Ordered    MR LUMBAR SPINE WO CONTRAST        04/14/21 0108             Elpidio AnisUpstill, Niylah Hassan, PA-C 04/16/21 0601    Mesner, Barbara CowerJason, MD 04/27/21 [phone]

## 2022-10-27 IMAGING — CR DG LUMBAR SPINE COMPLETE 4+V
5 series · 5 of 5 positions shown · non-contrast
Comparison: None.

CLINICAL DATA: Back pain

EXAM:
LUMBAR SPINE - COMPLETE 4+ VIEW

[t lumbar spine ap]
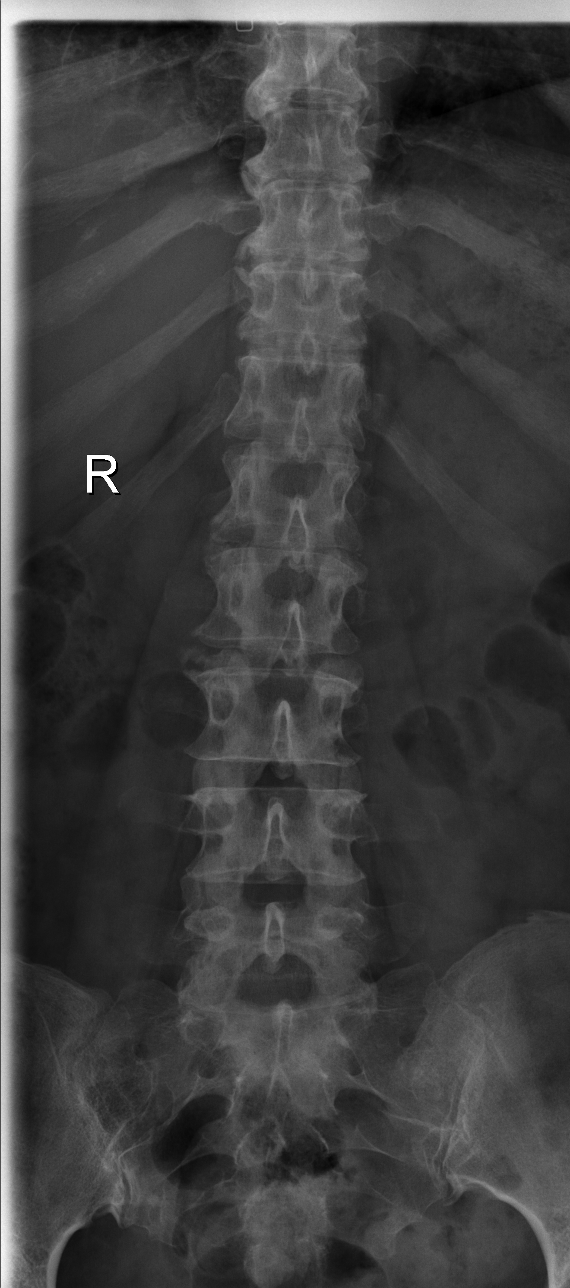

[t lumbar spine obl (1 of 2)]
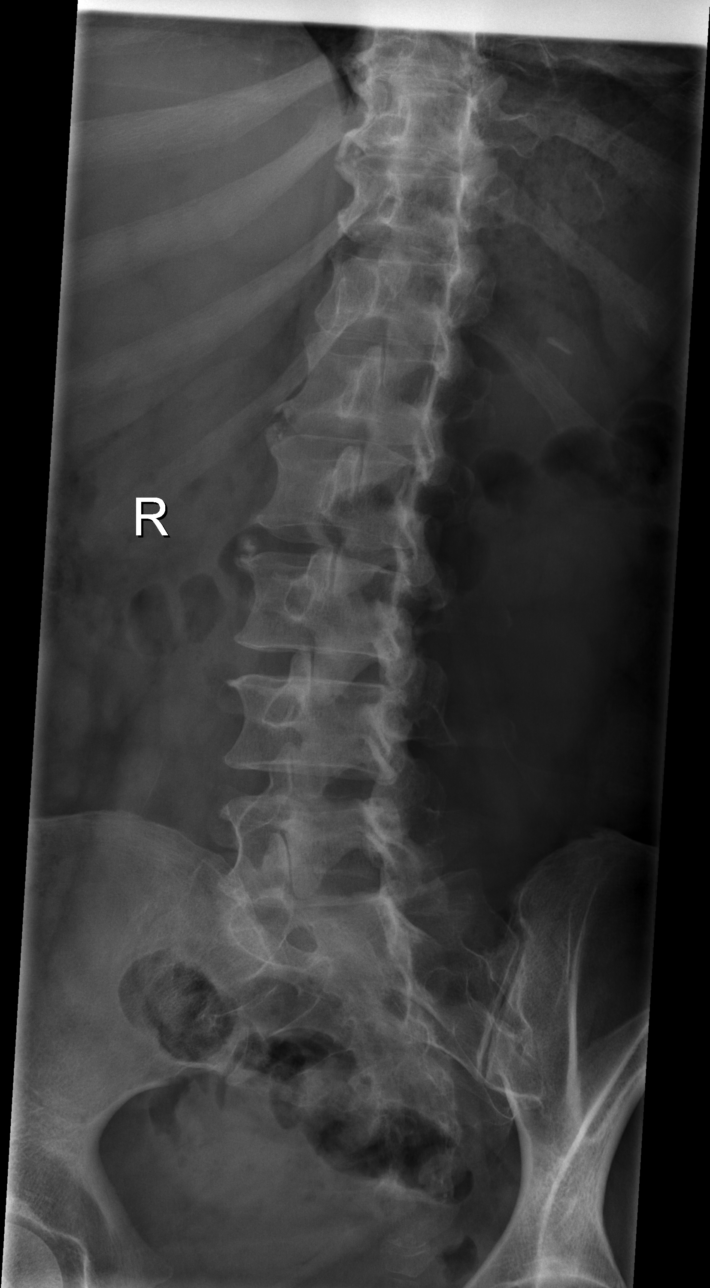

[t lumbar spine obl (2 of 2)]
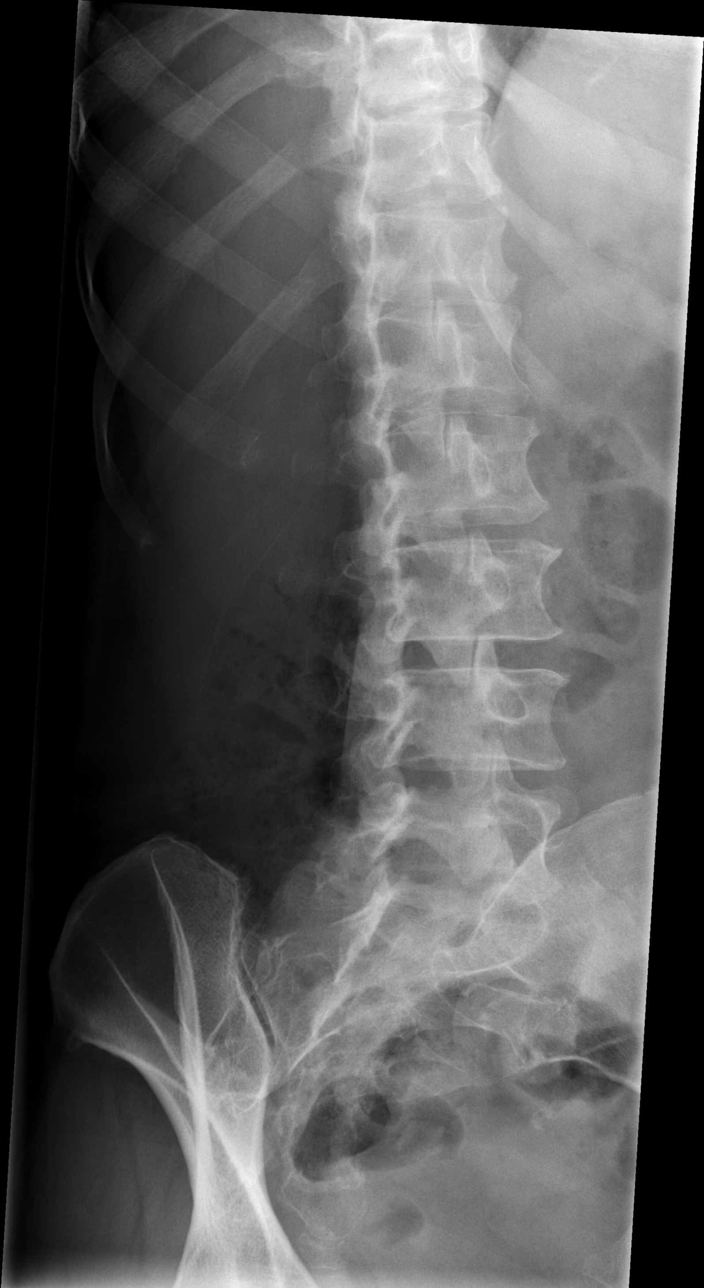

[t lumbar spine lat]
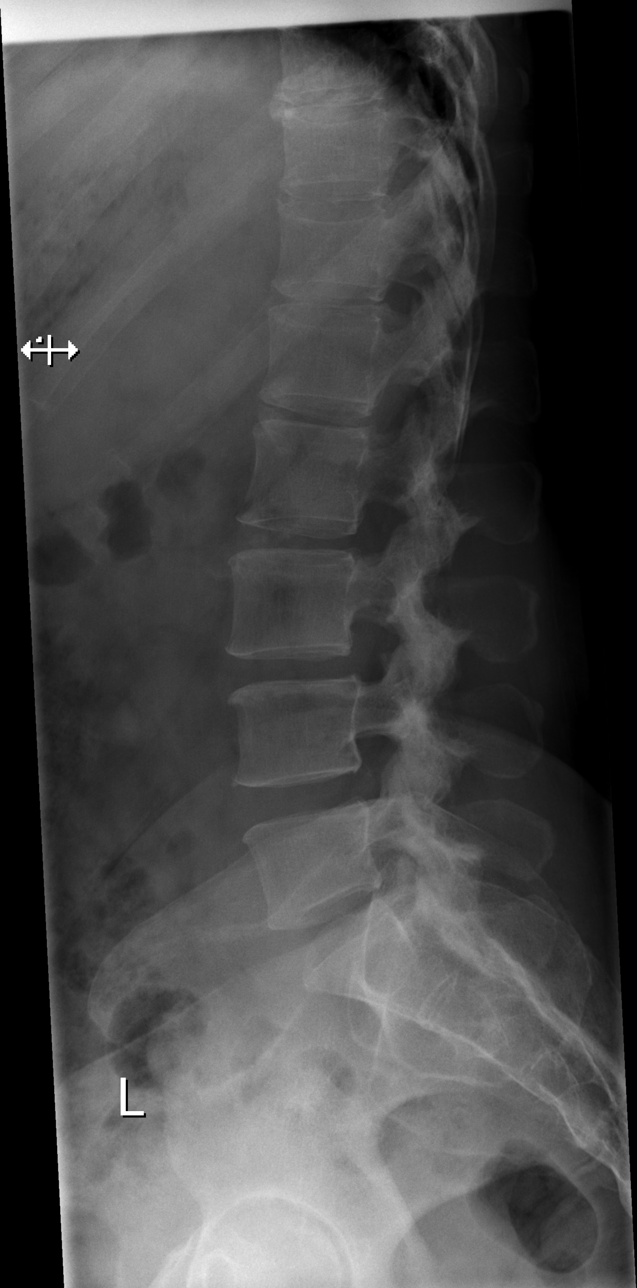

[t lumbar l-5 s-1 spot]
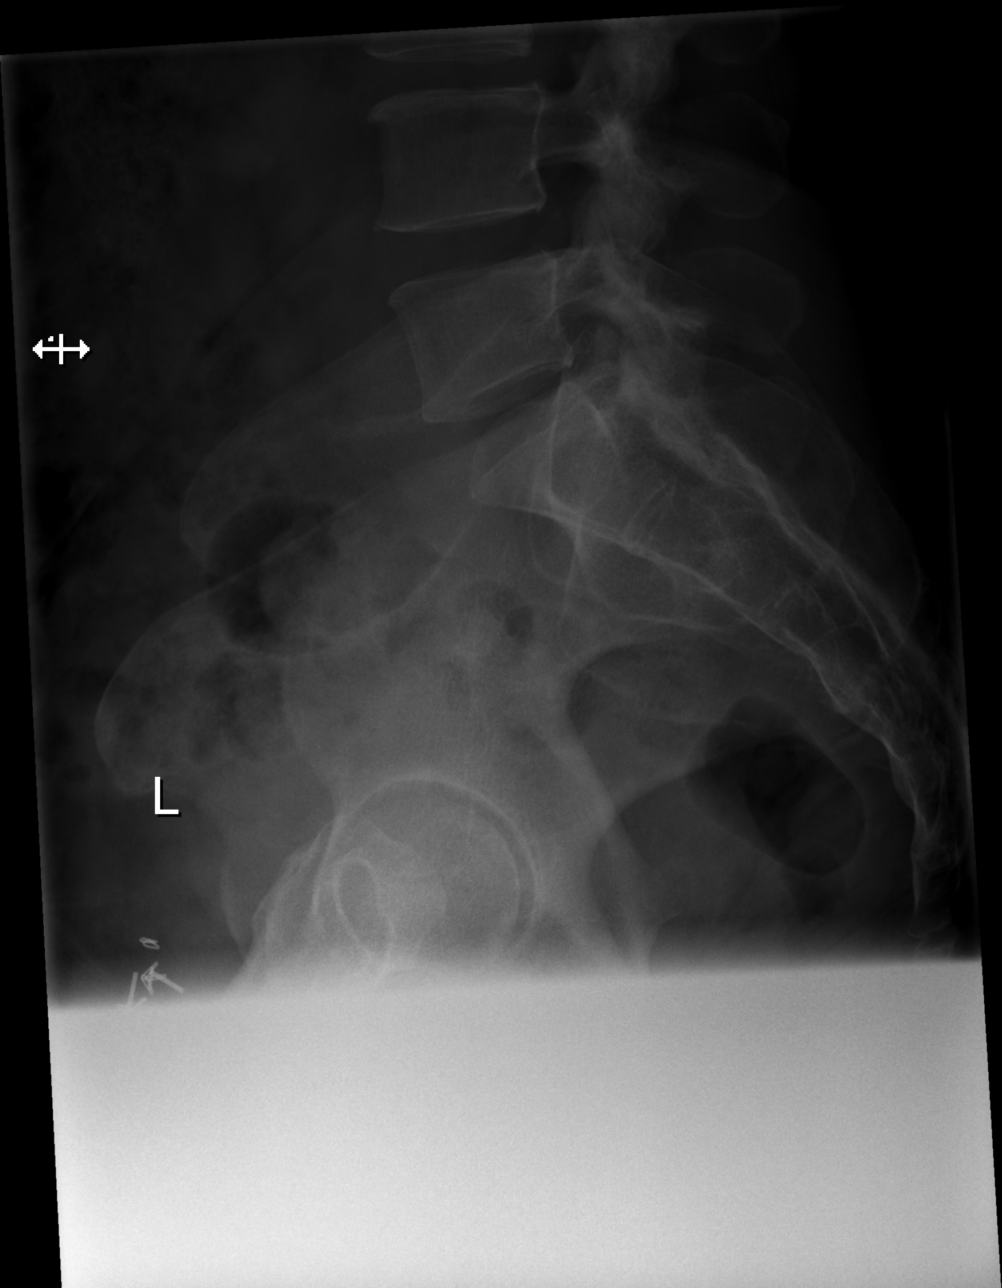

[5 of 5 positions shown; findings below may reference images not displayed]

FINDINGS: Five lumbar-type vertebral bodies.

Normal lumbar lordosis.

No evidence of fracture or dislocation. Vertebral body heights are
maintained.

Mild degenerative changes at T10-11.

Visualized bony pelvis appears intact.
IMPRESSION: Negative.

## 2024-10-18 ENCOUNTER — Ambulatory Visit: Admitting: Dermatology
